# Patient Record
Sex: Female | Born: 1985 | Race: Black or African American | Hispanic: No | Marital: Single | Smoking: Former smoker
Health system: Southern US, Community
[De-identification: ages and names within clinical notes are randomized; demographics above are authoritative.]

## PROBLEM LIST (undated history)

## (undated) ENCOUNTER — Inpatient Hospital Stay (HOSPITAL_COMMUNITY): Payer: Self-pay

## (undated) DIAGNOSIS — A599 Trichomoniasis, unspecified: Secondary | ICD-10-CM

## (undated) DIAGNOSIS — Z202 Contact with and (suspected) exposure to infections with a predominantly sexual mode of transmission: Secondary | ICD-10-CM

## (undated) DIAGNOSIS — B999 Unspecified infectious disease: Secondary | ICD-10-CM

## (undated) HISTORY — PX: NO PAST SURGERIES: SHX2092

---

## 2007-03-19 ENCOUNTER — Emergency Department (HOSPITAL_COMMUNITY): Admission: EM | Admit: 2007-03-19 | Discharge: 2007-03-20 | Payer: Self-pay | Admitting: Emergency Medicine

## 2007-10-09 ENCOUNTER — Emergency Department (HOSPITAL_COMMUNITY): Admission: EM | Admit: 2007-10-09 | Discharge: 2007-10-09 | Payer: Self-pay | Admitting: Emergency Medicine

## 2008-04-11 ENCOUNTER — Emergency Department (HOSPITAL_COMMUNITY): Admission: EM | Admit: 2008-04-11 | Discharge: 2008-04-12 | Payer: Self-pay | Admitting: Emergency Medicine

## 2009-03-18 ENCOUNTER — Inpatient Hospital Stay (HOSPITAL_COMMUNITY): Admission: AD | Admit: 2009-03-18 | Discharge: 2009-03-18 | Payer: Self-pay | Admitting: Obstetrics & Gynecology

## 2009-07-04 ENCOUNTER — Emergency Department (HOSPITAL_COMMUNITY): Admission: EM | Admit: 2009-07-04 | Discharge: 2009-07-04 | Payer: Self-pay | Admitting: Emergency Medicine

## 2009-07-10 ENCOUNTER — Emergency Department (HOSPITAL_COMMUNITY): Admission: EM | Admit: 2009-07-10 | Discharge: 2009-07-10 | Payer: Self-pay | Admitting: Family Medicine

## 2009-07-23 ENCOUNTER — Emergency Department (HOSPITAL_COMMUNITY): Admission: EM | Admit: 2009-07-23 | Discharge: 2009-07-24 | Payer: Self-pay | Admitting: Emergency Medicine

## 2009-09-09 ENCOUNTER — Inpatient Hospital Stay (HOSPITAL_COMMUNITY): Admission: AD | Admit: 2009-09-09 | Discharge: 2009-09-09 | Payer: Self-pay | Admitting: Family Medicine

## 2009-09-23 ENCOUNTER — Emergency Department (HOSPITAL_COMMUNITY): Admission: EM | Admit: 2009-09-23 | Discharge: 2009-09-23 | Payer: Self-pay | Admitting: Family Medicine

## 2010-04-01 ENCOUNTER — Emergency Department (HOSPITAL_COMMUNITY): Admission: EM | Admit: 2010-04-01 | Discharge: 2010-04-02 | Payer: Self-pay | Admitting: Emergency Medicine

## 2010-07-27 LAB — URINALYSIS, ROUTINE W REFLEX MICROSCOPIC
Bilirubin Urine: NEGATIVE
Glucose, UA: NEGATIVE mg/dL
Nitrite: NEGATIVE
Specific Gravity, Urine: 1.022 (ref 1.005–1.030)
Urobilinogen, UA: 0.2 mg/dL (ref 0.0–1.0)

## 2010-08-03 LAB — POCT URINALYSIS DIP (DEVICE)
Glucose, UA: NEGATIVE mg/dL
Hgb urine dipstick: NEGATIVE
Nitrite: NEGATIVE
Protein, ur: NEGATIVE mg/dL
pH: 5.5 (ref 5.0–8.0)

## 2010-08-03 LAB — WET PREP, GENITAL: WBC, Wet Prep HPF POC: NONE SEEN

## 2010-08-03 LAB — URINALYSIS, ROUTINE W REFLEX MICROSCOPIC
Bilirubin Urine: NEGATIVE
Nitrite: NEGATIVE
Protein, ur: NEGATIVE mg/dL
pH: 7.5 (ref 5.0–8.0)

## 2010-08-03 LAB — POCT PREGNANCY, URINE
Preg Test, Ur: NEGATIVE
Preg Test, Ur: NEGATIVE

## 2010-08-03 LAB — GC/CHLAMYDIA PROBE AMP, GENITAL: Chlamydia, DNA Probe: NEGATIVE

## 2010-08-04 LAB — WET PREP, GENITAL
Clue Cells Wet Prep HPF POC: NONE SEEN
Trich, Wet Prep: NONE SEEN

## 2010-08-04 LAB — POCT PREGNANCY, URINE: Preg Test, Ur: NEGATIVE

## 2010-08-18 LAB — GC/CHLAMYDIA PROBE AMP, GENITAL
Chlamydia, DNA Probe: NEGATIVE
GC Probe Amp, Genital: NEGATIVE

## 2010-08-18 LAB — URINALYSIS, ROUTINE W REFLEX MICROSCOPIC: Protein, ur: NEGATIVE mg/dL

## 2010-08-18 LAB — CBC
HCT: 40 % (ref 36.0–46.0)
RDW: 13.5 % (ref 11.5–15.5)
WBC: 3.6 10*3/uL — ABNORMAL LOW (ref 4.0–10.5)

## 2010-08-18 LAB — WET PREP, GENITAL: Clue Cells Wet Prep HPF POC: NONE SEEN

## 2010-08-18 LAB — URINE MICROSCOPIC-ADD ON

## 2010-10-06 ENCOUNTER — Emergency Department (HOSPITAL_COMMUNITY)
Admission: EM | Admit: 2010-10-06 | Discharge: 2010-10-07 | Disposition: A | Discharge status: Home / Self Care | Payer: Self-pay | Attending: Emergency Medicine | Admitting: Emergency Medicine

## 2010-10-06 DIAGNOSIS — R5383 Other fatigue: Secondary | ICD-10-CM | POA: Insufficient documentation

## 2010-10-06 DIAGNOSIS — R000 Tachycardia, unspecified: Secondary | ICD-10-CM | POA: Insufficient documentation

## 2010-10-06 DIAGNOSIS — R109 Unspecified abdominal pain: Secondary | ICD-10-CM | POA: Insufficient documentation

## 2010-10-06 DIAGNOSIS — R5381 Other malaise: Secondary | ICD-10-CM | POA: Insufficient documentation

## 2010-10-06 DIAGNOSIS — R070 Pain in throat: Secondary | ICD-10-CM | POA: Insufficient documentation

## 2010-10-06 DIAGNOSIS — J020 Streptococcal pharyngitis: Secondary | ICD-10-CM | POA: Insufficient documentation

## 2010-10-06 DIAGNOSIS — R110 Nausea: Secondary | ICD-10-CM | POA: Insufficient documentation

## 2010-10-06 DIAGNOSIS — IMO0001 Reserved for inherently not codable concepts without codable children: Secondary | ICD-10-CM | POA: Insufficient documentation

## 2010-10-06 DIAGNOSIS — R509 Fever, unspecified: Secondary | ICD-10-CM | POA: Insufficient documentation

## 2010-10-06 LAB — BASIC METABOLIC PANEL
CO2: 24 mEq/L (ref 19–32)
Calcium: 9.5 mg/dL (ref 8.4–10.5)
GFR calc non Af Amer: >60 mL/min (ref >60)

## 2010-10-06 LAB — CBC
Hemoglobin: 14.1 g/dL (ref 12.0–15.0)
MCV: 81.4 fL (ref 78.0–100.0)
RDW: 13.4 % (ref 11.5–15.5)
WBC: 9.6 10*3/uL (ref 4.0–10.5)

## 2010-10-06 LAB — URINALYSIS, ROUTINE W REFLEX MICROSCOPIC
Bilirubin Urine: NEGATIVE
Hgb urine dipstick: NEGATIVE
Ketones, ur: >80 mg/dL — AB
Nitrite: NEGATIVE
Urobilinogen, UA: 1 mg/dL (ref 0.0–1.0)

## 2010-10-06 LAB — DIFFERENTIAL
Eosinophils Absolute: 0 10*3/uL (ref 0.0–0.7)
Lymphs Abs: 1.2 10*3/uL (ref 0.7–4.0)
Monocytes Absolute: 0.6 10*3/uL (ref 0.1–1.0)
Monocytes Relative: 6 % (ref 3–12)

## 2010-10-06 LAB — URINE MICROSCOPIC-ADD ON

## 2011-02-15 LAB — COMPREHENSIVE METABOLIC PANEL
ALT: 27
AST: 35
Albumin: 3.8
Alkaline Phosphatase: 61
Glucose, Bld: 91
Potassium: 3.7
Sodium: 137
Total Protein: 6.4

## 2011-02-15 LAB — DIFFERENTIAL
Basophils Relative: 0
Eosinophils Absolute: 0
Eosinophils Relative: 1
Monocytes Absolute: 0.4
Monocytes Relative: 9
Neutrophils Relative %: 48

## 2011-02-15 LAB — URINALYSIS, ROUTINE W REFLEX MICROSCOPIC
Ketones, ur: NEGATIVE
Nitrite: NEGATIVE
Specific Gravity, Urine: 1.025
pH: 7

## 2011-02-15 LAB — CBC
Hemoglobin: 13.5
RBC: 4.79
RDW: 13.8

## 2011-02-15 LAB — POCT PREGNANCY, URINE: Preg Test, Ur: NEGATIVE

## 2011-02-22 LAB — COMPREHENSIVE METABOLIC PANEL
AST: 26
Albumin: 4.2
BUN: 5 — ABNORMAL LOW
CO2: 32
Calcium: 9.4
Creatinine, Ser: 0.74
GFR calc Af Amer: >60
GFR calc non Af Amer: >60

## 2011-02-22 LAB — URINALYSIS, ROUTINE W REFLEX MICROSCOPIC
Bilirubin Urine: NEGATIVE
Glucose, UA: NEGATIVE
Hgb urine dipstick: NEGATIVE
Ketones, ur: NEGATIVE
pH: 7.5

## 2011-02-22 LAB — DIFFERENTIAL
Basophils Absolute: 0
Eosinophils Relative: 2
Lymphocytes Relative: 50 — ABNORMAL HIGH
Lymphs Abs: 3.1
Neutro Abs: 2.5

## 2011-02-22 LAB — URINE CULTURE: Colony Count: >=100000

## 2011-02-22 LAB — CBC
HCT: 40.7
MCHC: 33.6
MCV: 81.5
Platelets: 282

## 2011-02-22 LAB — LIPASE, BLOOD: Lipase: 32

## 2011-02-22 LAB — URINE MICROSCOPIC-ADD ON

## 2011-12-06 ENCOUNTER — Emergency Department (HOSPITAL_COMMUNITY)
Admission: EM | Admit: 2011-12-06 | Discharge: 2011-12-06 | Disposition: A | Discharge status: Home / Self Care | Payer: BC Managed Care – PPO | Attending: Emergency Medicine | Admitting: Emergency Medicine

## 2011-12-06 ENCOUNTER — Encounter (HOSPITAL_COMMUNITY): Payer: Self-pay | Admitting: Emergency Medicine

## 2011-12-06 DIAGNOSIS — F17200 Nicotine dependence, unspecified, uncomplicated: Secondary | ICD-10-CM | POA: Insufficient documentation

## 2011-12-06 DIAGNOSIS — F4321 Adjustment disorder with depressed mood: Secondary | ICD-10-CM | POA: Insufficient documentation

## 2011-12-06 MED ORDER — ALPRAZOLAM 0.5 MG PO TABS: 0.5000 mg | ORAL_TABLET | Freq: Every evening | ORAL | Status: AC | PRN

## 2011-12-06 MED ORDER — LORAZEPAM 2 MG/ML IJ SOLN
1.0000 mg | Freq: Once | INTRAMUSCULAR | Status: AC
  Administered 2011-12-06: 1 mg via INTRAVENOUS
  Filled 2011-12-06: qty 1
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
## 2011-12-06 NOTE — ED Provider Notes (Signed)
Medical screening examination/treatment/procedure(s) were performed by non-physician practitioner and as supervising physician I was immediately available for consultation/collaboration.   Esmeralda Malay, MD 12/06/11 0708 
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
## 2014-10-06 ENCOUNTER — Inpatient Hospital Stay (HOSPITAL_COMMUNITY): Payer: Medicaid Other

## 2014-10-06 ENCOUNTER — Inpatient Hospital Stay (HOSPITAL_COMMUNITY)
Admission: AD | Admit: 2014-10-06 | Discharge: 2014-10-06 | Disposition: A | Discharge status: Home / Self Care | Payer: Medicaid Other | Source: Ambulatory Visit | Attending: Obstetrics & Gynecology | Admitting: Obstetrics & Gynecology

## 2014-10-06 ENCOUNTER — Encounter (HOSPITAL_COMMUNITY): Payer: Self-pay | Admitting: *Deleted

## 2014-10-06 DIAGNOSIS — R109 Unspecified abdominal pain: Secondary | ICD-10-CM | POA: Diagnosis not present

## 2014-10-06 DIAGNOSIS — Z3A01 Less than 8 weeks gestation of pregnancy: Secondary | ICD-10-CM | POA: Insufficient documentation

## 2014-10-06 DIAGNOSIS — O9989 Other specified diseases and conditions complicating pregnancy, childbirth and the puerperium: Secondary | ICD-10-CM | POA: Diagnosis not present

## 2014-10-06 DIAGNOSIS — O26899 Other specified pregnancy related conditions, unspecified trimester: Secondary | ICD-10-CM

## 2014-10-06 HISTORY — DX: Contact with and (suspected) exposure to infections with a predominantly sexual mode of transmission: Z20.2

## 2014-10-06 LAB — URINALYSIS, ROUTINE W REFLEX MICROSCOPIC
Bilirubin Urine: NEGATIVE
Glucose, UA: NEGATIVE mg/dL
Hgb urine dipstick: NEGATIVE
Ketones, ur: NEGATIVE mg/dL
LEUKOCYTES UA: NEGATIVE
Nitrite: NEGATIVE
PH: 6.5 (ref 5.0–8.0)
PROTEIN: NEGATIVE mg/dL
Specific Gravity, Urine: 1.015 (ref 1.005–1.030)
Urobilinogen, UA: 0.2 mg/dL (ref 0.0–1.0)

## 2014-10-06 LAB — HCG, QUANTITATIVE, PREGNANCY: HCG, BETA CHAIN, QUANT, S: 93 m[IU]/mL — AB (ref <5)

## 2014-10-06 LAB — POCT PREGNANCY, URINE: PREG TEST UR: POSITIVE — AB

## 2014-10-06 NOTE — Discharge Instructions (Signed)

## 2014-10-06 NOTE — MAU Note (Signed)
+  HPT on Sat. Having sharp little pains in lower stomach, started yesterday.

## 2014-10-06 NOTE — MAU Provider Note (Signed)
  History   G1 in c/o early pregnant having intermittat sharp low abd pain this is pts first pregnancy. Unsure of dates.  CSN: [id number]  Arrival date and time: 10/06/14 1421   None     Chief Complaint  Patient presents with  . Abdominal Pain  . Possible Pregnancy   HPI  OB History    Gravida Para Term Preterm AB TAB SAB Ectopic Multiple Living   1               Past Medical History  Diagnosis Date  . Trichomonas contact, treated     History reviewed. No pertinent past surgical history.  History reviewed. No pertinent family history.  History  Substance Use Topics  . Smoking status: Current Every Day Smoker  . Smokeless tobacco: Not on file  . Alcohol Use: No    Allergies: No Known Allergies  No prescriptions prior to admission    Review of Systems  Constitutional: Negative.   HENT: Negative.   Eyes: Negative.   Respiratory: Negative.   Cardiovascular: Negative.   Gastrointestinal: Positive for abdominal pain.  Genitourinary: Negative.   Musculoskeletal: Negative.   Skin: Negative.   Neurological: Negative.   Endo/Heme/Allergies: Negative.   Psychiatric/Behavioral: Negative.    Physical Exam   Blood pressure 114/62, pulse 77, temperature 98.5 F (36.9 C), temperature source Oral, resp. rate 16, last menstrual period 09/11/2014.  Physical Exam  Constitutional: She is oriented to person, place, and time. She appears well-developed and well-nourished.  HENT:  Head: Normocephalic.  Eyes: Pupils are equal, round, and reactive to light.  Neck: Normal range of motion.  Cardiovascular: Normal rate, regular rhythm, normal heart sounds and intact distal pulses.   Respiratory: Effort normal and breath sounds normal.  GI: Soft. Bowel sounds are normal.  Genitourinary: Vagina normal and uterus normal.  Musculoskeletal: Normal range of motion.  Neurological: She is alert and oriented to person, place, and time. She has normal reflexes.  Skin: Skin is warm  and dry.  Psychiatric: She has a normal mood and affect. Her behavior is normal. Judgment and thought content normal.    MAU Course  Procedures  MDM abd pain in early pregnancy  Assessment and Plan  Quant, abo, pelvic u/s to r/o ectopic.  Quant 93 u/s normal will repeat quant on 25th and d/c home.  Wyvonnia DuskyLAWSON, Annisa Mazzarella DARLENE 10/06/2014, 4:51 PM

## 2014-10-07 ENCOUNTER — Telehealth: Payer: Self-pay | Admitting: *Deleted

## 2014-10-07 LAB — ABO/RH: ABO/RH(D): B POS

## 2014-10-07 NOTE — Telephone Encounter (Signed)
Pt left message stating that she needs prenatal vitamin prescription. Per chart review, pt was seen @ MAU yesterday, is early pregnant with unsure dates and BHCG 93. I called pt and advised her that she may purchase prenatal vitamins without a prescription at any pharmacy. If preferred, she may wait for her follow up testing to confirm that her pregnancy is progressing before beginning the vitamins. Pt voiced understanding.

## 2014-10-08 ENCOUNTER — Encounter (HOSPITAL_COMMUNITY): Payer: Self-pay

## 2014-10-08 ENCOUNTER — Inpatient Hospital Stay (HOSPITAL_COMMUNITY)
Admission: AD | Admit: 2014-10-08 | Discharge: 2014-10-08 | Disposition: A | Discharge status: Home / Self Care | Payer: Medicaid Other | Source: Ambulatory Visit | Attending: Obstetrics & Gynecology | Admitting: Obstetrics & Gynecology

## 2014-10-08 DIAGNOSIS — R888 Abnormal findings in other body fluids and substances: Secondary | ICD-10-CM | POA: Insufficient documentation

## 2014-10-08 DIAGNOSIS — O26899 Other specified pregnancy related conditions, unspecified trimester: Secondary | ICD-10-CM

## 2014-10-08 DIAGNOSIS — O9989 Other specified diseases and conditions complicating pregnancy, childbirth and the puerperium: Secondary | ICD-10-CM | POA: Insufficient documentation

## 2014-10-08 DIAGNOSIS — O0281 Inappropriate change in quantitative human chorionic gonadotropin (hCG) in early pregnancy: Secondary | ICD-10-CM

## 2014-10-08 DIAGNOSIS — R109 Unspecified abdominal pain: Secondary | ICD-10-CM | POA: Diagnosis present

## 2014-10-08 DIAGNOSIS — Z3A01 Less than 8 weeks gestation of pregnancy: Secondary | ICD-10-CM | POA: Diagnosis not present

## 2014-10-08 LAB — HCG, QUANTITATIVE, PREGNANCY: HCG, BETA CHAIN, QUANT, S: 257 m[IU]/mL — AB (ref <5)

## 2014-10-08 NOTE — Discharge Instructions (Signed)

## 2014-10-08 NOTE — MAU Provider Note (Signed)
Subjective:  Sonia Maxwell is a 29 y.o. female G2P0 at 403w6d who presents to MAU for a beta hcg level. She was originally seen two days ago with abdominal pain. Currently she has no pain or bleeding.    Objective:  GENERAL: Well-developed, well-nourished female in no acute distress.  LUNGS: Effort normal SKIN: Warm, dry and without erythema PSYCH: Normal mood and affect  Filed Vitals:   10/08/14 1754  BP: 108/67  Pulse: 71  Temp: 98.8 F (37.1 C)  Resp: 18    MDM: Quant 5/23: 93 Quant 5/25: 257   Assessment:  1. Elevated level of quantitative hCG for gestational age in early pregnancy   2. Abdominal pain in pregnancy      Plan:  Discharge home in stable condition  Repeat US in 7 days; US to call and schedule Pelvic rest Ectopic precautions  Return to MAU if symptoms worsen     Duane LopeJennifer I Luetta Piazza, NP 10/08/2014 7:08 PM

## 2014-10-08 NOTE — MAU Note (Signed)
Urine in lab 

## 2014-10-08 NOTE — MAU Note (Signed)
Pt presents for follow up bloodwork. Denies pain or bleeding.

## 2014-10-15 ENCOUNTER — Inpatient Hospital Stay (HOSPITAL_COMMUNITY)
Admission: AD | Admit: 2014-10-15 | Discharge: 2014-10-15 | Disposition: A | Discharge status: Home / Self Care | Payer: Medicaid Other | Source: Ambulatory Visit | Attending: Family Medicine | Admitting: Family Medicine

## 2014-10-15 ENCOUNTER — Ambulatory Visit (HOSPITAL_COMMUNITY)
Admission: RE | Admit: 2014-10-15 | Discharge: 2014-10-15 | Disposition: A | Discharge status: Home / Self Care | Payer: Medicaid Other | Source: Ambulatory Visit | Attending: Obstetrics and Gynecology | Admitting: Obstetrics and Gynecology

## 2014-10-15 ENCOUNTER — Other Ambulatory Visit: Payer: Self-pay | Admitting: Obstetrics and Gynecology

## 2014-10-15 ENCOUNTER — Encounter (HOSPITAL_COMMUNITY): Payer: Self-pay | Admitting: Obstetrics and Gynecology

## 2014-10-15 DIAGNOSIS — Z36 Encounter for antenatal screening of mother: Secondary | ICD-10-CM | POA: Diagnosis not present

## 2014-10-15 DIAGNOSIS — O34531 Maternal care for retroversion of gravid uterus, first trimester: Secondary | ICD-10-CM | POA: Insufficient documentation

## 2014-10-15 DIAGNOSIS — R109 Unspecified abdominal pain: Principal | ICD-10-CM

## 2014-10-15 DIAGNOSIS — O26899 Other specified pregnancy related conditions, unspecified trimester: Secondary | ICD-10-CM

## 2014-10-15 DIAGNOSIS — O9989 Other specified diseases and conditions complicating pregnancy, childbirth and the puerperium: Secondary | ICD-10-CM | POA: Insufficient documentation

## 2014-10-15 DIAGNOSIS — O0281 Inappropriate change in quantitative human chorionic gonadotropin (hCG) in early pregnancy: Secondary | ICD-10-CM

## 2014-10-15 DIAGNOSIS — Z3A01 Less than 8 weeks gestation of pregnancy: Secondary | ICD-10-CM | POA: Insufficient documentation

## 2014-10-15 NOTE — MAU Note (Cosign Needed)
Chief Complaint: Follow-up      SUBJECTIVE HPI: Sonia Maxwell is a 29 y.o. G2P0010 at [redacted]w[redacted]d by LMP who presents for [place] results. She was initially seen here 10/06/2014 for lower abdominal pain in early pregnancy. She was seen again 10/08/2014 for follow-up quant and had appropriate rise. She has had no vaginal bleeding. She has had no abdominal pain since last seen. She is beginning to have some subjective symptoms of pregnancy.   Past Medical History  Diagnosis Date  . Trichomonas contact, treated    OB History  Gravida Para Term Preterm AB SAB TAB Ectopic Multiple Living  2             # Outcome Date GA Lbr Len/2nd Weight Sex Delivery Anes PTL Lv  2 Current           1 Gravida                No past surgical history on file. History   Social History  . Marital Status: Single    Spouse Name: N/A  . Number of Children: N/A  . Years of Education: N/A   Occupational History  . Not on file.   Social History Main Topics  . Smoking status: Current Every Day Smoker  . Smokeless tobacco: Not on file  . Alcohol Use: No  . Drug Use: No  . Sexual Activity: Yes     Comment: last sex Oct 04 2014   Other Topics Concern  . Not on file   Social History Narrative   No current facility-administered medications on file prior to encounter.   No current outpatient prescriptions on file prior to encounter.   No Known Allergies  ROS  OBJECTIVE Blood pressure 108/67, pulse 71, temperature 98.8 F (37.1 C), temperature source Oral, resp. rate 18, last menstrual period 09/11/2014. GENERAL: Well-developed, well-nourished female in no acute distress.  ABD: NT  LAB RESULTS   IMAGING US Ob Comp Less 14 Wks  10/06/2014   CLINICAL DATA:  Abdominal pain. Pregnant patient. Patient 3 weeks and 4 days pregnant based on her last menstrual period. Beta HCG level is pending.  EXAM: OBSTETRIC <14 WK [place] AND TRANSVAGINAL OB US  TECHNIQUE: Both transabdominal and transvaginal ultrasound  examinations were performed for complete evaluation of the gestation as well as the maternal uterus, adnexal regions, and pelvic cul-de-sac. Transvaginal technique was performed to assess early pregnancy.  COMPARISON:  None.  FINDINGS: Intrauterine gestational sac: None  Yolk sac:  None  Embryo:  No  Maternal uterus/adnexae: No uterine masses. Endometrium measures 12 mm in thickness. No endometrial mass or fluid. Cervix is closed and unremarkable. Small fluid collections seen along the anterior margin of the uterine fundus. This reflect fluid in bowel. It may reflect small collection physiologic peritoneal fluid in the vesicular uterine pelvic recess. Ovaries are unremarkable. No adnexal mass.  IMPRESSION: 1. No evidence of intrauterine pregnancy. However, findings may still reflect an early intrauterine pregnancy. 2. No adnexal mass. Ovaries are unremarkable. No uterine masses. No findings to explain abdominal pain. 3. Small fluid collects over the anterior uterine fundus, nonspecific. If the diagnosis includes a loop of bowel, small area focal physiologic free fluid or an inclusion cyst. 4. Recommend followup beta HCG levels, followup ultrasound in 7-10 days to document normal pregnancy progression.   Electronically Signed   By: Amie Portland M.D.   On: 10/06/2014 17:47   US Ob Transvaginal  10/15/2014   CLINICAL DATA:  Elevated HCG.  Abdominal pain.  EXAM: TRANSVAGINAL OB ULTRASOUND  TECHNIQUE: Transvaginal ultrasound was performed for complete evaluation of the gestation as well as the maternal uterus, adnexal regions, and pelvic cul-de-sac.  COMPARISON:  10/06/2014  FINDINGS: Intrauterine gestational sac: Visualized/normal in shape.  Yolk sac:  Not visualized  Embryo:  Not visualized  Cardiac Activity: Not visualize  Heart Rate:  bpm  MSD: 4.7  mm   5 w   0  d  CRL:     mm    w  d                  US EDC:  Maternal uterus/adnexae: No subchorionic hemorrhage. Uterus is retroverted. No adnexal masses. Trace  free fluid in the pelvis.  IMPRESSION: Early intrauterine gestational sac, 5 weeks 0 days by mean sac diameter. No yolk sac or fetal pole currently. Recommend continued followup with repeat ultrasound in 14 days to evaluate for continued expected progression.   Electronically Signed   By: Charlett NoseKevin  Dover M.D.   On: 10/15/2014 14:45   [place] Ob Transvaginal  10/06/2014   CLINICAL DATA:  Abdominal pain. Pregnant patient. Patient 3 weeks and 4 days pregnant based on her last menstrual period. Beta HCG level is pending.  EXAM: OBSTETRIC <14 WK US AND TRANSVAGINAL OB US  TECHNIQUE: Both transabdominal and transvaginal ultrasound examinations were performed for complete evaluation of the gestation as well as the maternal uterus, adnexal regions, and pelvic cul-de-sac. Transvaginal technique was performed to assess early pregnancy.  COMPARISON:  None.  FINDINGS: Intrauterine gestational sac: None  Yolk sac:  None  Embryo:  No  Maternal uterus/adnexae: No uterine masses. Endometrium measures 12 mm in thickness. No endometrial mass or fluid. Cervix is closed and unremarkable. Small fluid collections seen along the anterior margin of the uterine fundus. This reflect fluid in bowel. It may reflect small collection physiologic peritoneal fluid in the vesicular uterine pelvic recess. Ovaries are unremarkable. No adnexal mass.  IMPRESSION: 1. No evidence of intrauterine pregnancy. However, findings may still reflect an early intrauterine pregnancy. 2. No adnexal mass. Ovaries are unremarkable. No uterine masses. No findings to explain abdominal pain. 3. Small fluid collects over the anterior uterine fundus, nonspecific. If the diagnosis includes a loop of bowel, small area focal physiologic free fluid or an inclusion cyst. 4. Recommend followup beta HCG levels, followup ultrasound in 7-10 days to document normal pregnancy progression.   Electronically Signed   By: Amie Portlandavid  Ormond M.D.   On: 10/06/2014 17:47  [place] Ob  Transvaginal  10/15/2014   CLINICAL DATA:  Elevated HCG.  Abdominal pain.  EXAM: TRANSVAGINAL OB ULTRASOUND  TECHNIQUE: Transvaginal ultrasound was performed for complete evaluation of the gestation as well as the maternal uterus, adnexal regions, and pelvic cul-de-sac.  COMPARISON:  10/06/2014  FINDINGS: Intrauterine gestational sac: Visualized/normal in shape.  Yolk sac:  Not visualized  Embryo:  Not visualized  Cardiac Activity: Not visualize  Heart Rate:  bpm  MSD: 4.7  mm   5 w   0  d  CRL:     mm    w  d                  US EDC:  Maternal uterus/adnexae: No subchorionic hemorrhage. Uterus is retroverted. No adnexal masses. Trace free fluid in the pelvis.  IMPRESSION: Early intrauterine gestational sac, 5 weeks 0 days by mean sac diameter. No yolk sac or fetal pole currently. Recommend continued followup with repeat ultrasound in  14 days to evaluate for continued expected progression.   Electronically Signed   By: Charlett Nose M.D.   On: 10/15/2014 14:45        Ref Range 7d ago  9d ago     hCG, Beta Chain, Quant, S <5 mIU/mL 257 (H) 93 (H)CM   Comments:              MAU COURSE  ASSESSMENT G2P0010 at [redacted]w[redacted]d Abd pain (resolved)  Pregnancy location and viability undetrmined   PLAN Discharge home with pain/bleeding precautions    Medication List    Notice    You have not been prescribed any medications.     [place] to schedule F/U US 10/27/14   Danae Orleans, CNM 10/15/2014  3:02 PM

## 2014-10-15 NOTE — Progress Notes (Signed)
Pt only seen by APP

## 2014-10-25 ENCOUNTER — Inpatient Hospital Stay (HOSPITAL_COMMUNITY)
Admission: AD | Admit: 2014-10-25 | Discharge: 2014-10-25 | Disposition: A | Discharge status: Home / Self Care | Payer: Medicaid Other | Source: Ambulatory Visit | Attending: Obstetrics & Gynecology | Admitting: Obstetrics & Gynecology

## 2014-10-25 ENCOUNTER — Encounter (HOSPITAL_COMMUNITY): Payer: Self-pay | Admitting: *Deleted

## 2014-10-25 ENCOUNTER — Inpatient Hospital Stay (HOSPITAL_COMMUNITY): Payer: Medicaid Other

## 2014-10-25 DIAGNOSIS — N760 Acute vaginitis: Secondary | ICD-10-CM

## 2014-10-25 DIAGNOSIS — O209 Hemorrhage in early pregnancy, unspecified: Secondary | ICD-10-CM

## 2014-10-25 DIAGNOSIS — N920 Excessive and frequent menstruation with regular cycle: Secondary | ICD-10-CM

## 2014-10-25 DIAGNOSIS — Z87891 Personal history of nicotine dependence: Secondary | ICD-10-CM | POA: Diagnosis not present

## 2014-10-25 DIAGNOSIS — O4690 Antepartum hemorrhage, unspecified, unspecified trimester: Secondary | ICD-10-CM

## 2014-10-25 DIAGNOSIS — N898 Other specified noninflammatory disorders of vagina: Secondary | ICD-10-CM | POA: Diagnosis present

## 2014-10-25 DIAGNOSIS — B9689 Other specified bacterial agents as the cause of diseases classified elsewhere: Secondary | ICD-10-CM

## 2014-10-25 LAB — URINALYSIS, ROUTINE W REFLEX MICROSCOPIC
Bilirubin Urine: NEGATIVE
GLUCOSE, UA: NEGATIVE mg/dL
KETONES UR: NEGATIVE mg/dL
Leukocytes, UA: NEGATIVE
Nitrite: NEGATIVE
Protein, ur: NEGATIVE mg/dL
Specific Gravity, Urine: 1.01 (ref 1.005–1.030)
Urobilinogen, UA: 0.2 mg/dL (ref 0.0–1.0)
pH: 5.5 (ref 5.0–8.0)

## 2014-10-25 LAB — HCG, QUANTITATIVE, PREGNANCY: hCG, Beta Chain, Quant, S: 3000 m[IU]/mL — ABNORMAL HIGH (ref <5)

## 2014-10-25 LAB — URINE MICROSCOPIC-ADD ON

## 2014-10-25 NOTE — MAU Provider Note (Signed)
History  29 yo G1P0 @ 6.2 wks by sure LMP on 09/11/14 presents to MAU after calling w/ c/o pink tinged discharge (noted only after wiping) since earlier today. Intercourse today. Currently undergoing treatment for BV. Denies cramping.   Patient Active Problem List   Diagnosis Date Noted  . Spotting 10/25/2014  . BV (bacterial vaginosis) 10/25/2014    Chief Complaint  Patient presents with  . Vaginal Discharge   HPI As above OB History    Gravida Para Term Preterm AB TAB SAB Ectopic Multiple Living   1               Past Medical History  Diagnosis Date  . Trichomonas contact, treated     Past Surgical History  Procedure Laterality Date  . No past surgeries      No family history on file.  History  Substance Use Topics  . Smoking status: Former Smoker    Quit date: 09/24/2014  . Smokeless tobacco: Not on file  . Alcohol Use: No    Allergies: No Known Allergies  Prescriptions prior to admission  Medication Sig Dispense Refill Last Dose  . metroNIDAZOLE (FLAGYL) 500 MG tablet Take 1 tablet by mouth 2 (two) times daily.  0 10/25/2014 at Unknown time  . Prenatal Vit-Fe Fumarate-FA (PRENATAL MULTIVITAMIN) TABS tablet Take 1 tablet by mouth daily at 12 noon.   10/25/2014 at Unknown time    ROS  As noted in history Physical Exam  CLINICAL DATA: Pinkish discharge. No pain.  EXAM: OBSTETRIC <14 WK ULTRASOUND  TECHNIQUE: Transabdominal ultrasound was performed for evaluation of the gestation as well as the maternal uterus and adnexal regions.  COMPARISON: 10/15/2014  FINDINGS: Intrauterine gestational sac: Single intrauterine sac  Yolk sac: No  Embryo: No  Cardiac Activity: No  MSD: 8.1 mm 5 w 3 d  Maternal uterus/adnexae: 2 fibroids are present, one in the fundus measuring 1.4 cm and the other posteriorly measuring 1.7 cm. No free pelvic fluid.  IMPRESSION: Findings are suspicious but not yet definitive for failed  pregnancy. Recommend follow-up US in 10-14 days for definitive diagnosis. This recommendation follows SRU consensus guidelines: Diagnostic Criteria for Nonviable Pregnancy Early in the First Trimester. Malva Limes Med 2013; 409:8119-14.   Electronically Signed  By: Ellery Plunk M.D.  On: 10/25/2014 21:57  Results for orders placed or performed during the hospital encounter of 10/25/14 (from the past 24 hour(s))  Urinalysis, Routine w reflex microscopic (not at [facility])     Status: Abnormal   Collection Time: 10/25/14  7:40 PM  Result Value Ref Range   Color, Urine YELLOW YELLOW   APPearance CLEAR CLEAR   Specific Gravity, Urine 1.010 1.005 - 1.030   pH 5.5 5.0 - 8.0   Glucose, UA NEGATIVE NEGATIVE mg/dL   Hgb urine dipstick TRACE (A) NEGATIVE   Bilirubin Urine NEGATIVE NEGATIVE   Ketones, ur NEGATIVE NEGATIVE mg/dL   Protein, ur NEGATIVE NEGATIVE mg/dL   Urobilinogen, UA 0.2 0.0 - 1.0 mg/dL   Nitrite NEGATIVE NEGATIVE   Leukocytes, UA NEGATIVE NEGATIVE  Urine microscopic-add on     Status: Abnormal   Collection Time: 10/25/14  7:40 PM  Result Value Ref Range   Squamous Epithelial / LPF FEW (A) RARE   WBC, UA 0-2 <3 WBC/hpf   RBC / HPF 0-2 <3 RBC/hpf   Bacteria, UA FEW (A) RARE  hCG, quantitative, pregnancy     Status: Abnormal   Collection Time: 10/25/14 10:20 PM  Result  Value Ref Range   hCG, Beta Chain, Quant, S 3000 (H) <5 mIU/mL    Blood pressure 109/58, pulse 79, temperature 98.6 F (37 C), resp. rate 18, height 5\' 3"  (1.6 m), weight 55.067 kg (121 lb 6.4 oz), last menstrual period 09/11/2014.   Physical Exam Gen: NAD Lungs: CTAB CV: RRR w/o M/R/G Abdomen: soft, NT, no guarding or rebound NEFG Speculum: No blood in vault. Cvx visually closed Bimanual: No adnexal tenderness/masses  ED Course  Assessment: Intrauterine gestational sac Quants 3000 BV  Plan: Office f/u on Monday and Wednesday of next week for repeat quants. Repeat u/s in 10-14 days per  Radiologist's recommendation. Pelvic rest. Continue treatment for BV. Strict bleeding precautions.   Sherre Scarlet CNM, MS 10/25/2014 11:02 PM

## 2014-10-25 NOTE — MAU Note (Addendum)
I just went to the Br and had pink d/c on toilet paper. No pain. Has been using metro-gel for couple days for BV

## 2014-10-27 ENCOUNTER — Ambulatory Visit (HOSPITAL_COMMUNITY): Payer: Medicaid Other

## 2015-02-11 ENCOUNTER — Inpatient Hospital Stay (HOSPITAL_COMMUNITY)
Admission: AD | Admit: 2015-02-11 | Discharge: 2015-02-11 | Discharge status: Against Medical Advice | Payer: Medicaid Other | Source: Ambulatory Visit | Attending: Obstetrics and Gynecology | Admitting: Obstetrics and Gynecology

## 2015-02-11 ENCOUNTER — Encounter (HOSPITAL_COMMUNITY): Payer: Self-pay | Admitting: *Deleted

## 2015-02-11 DIAGNOSIS — R197 Diarrhea, unspecified: Secondary | ICD-10-CM | POA: Diagnosis present

## 2015-02-11 HISTORY — DX: Trichomoniasis, unspecified: A59.9

## 2015-02-11 HISTORY — DX: Unspecified infectious disease: B99.9

## 2015-02-11 LAB — URINALYSIS, ROUTINE W REFLEX MICROSCOPIC
Bilirubin Urine: NEGATIVE
GLUCOSE, UA: NEGATIVE mg/dL
KETONES UR: NEGATIVE mg/dL
LEUKOCYTES UA: NEGATIVE
NITRITE: NEGATIVE
PROTEIN: NEGATIVE mg/dL
Specific Gravity, Urine: >1.03 — ABNORMAL HIGH (ref 1.005–1.030)
UROBILINOGEN UA: 0.2 mg/dL (ref 0.0–1.0)
pH: 6 (ref 5.0–8.0)

## 2015-02-11 LAB — URINE MICROSCOPIC-ADD ON

## 2015-02-11 LAB — POCT PREGNANCY, URINE: PREG TEST UR: NEGATIVE

## 2015-02-11 NOTE — MAU Note (Signed)
Was [facility] at the end of August, they told her she was preg.  Had blood work drawn, was never called with results.  Had SAB in June

## 2015-02-11 NOTE — Progress Notes (Signed)
V. Standard, CNM in to assess patient.

## 2015-02-11 NOTE — MAU Note (Signed)
Call Venus Standard CNM with patient notification will be a little while before she can evaluate patient in MAU in L&D.

## 2015-02-11 NOTE — MAU Provider Note (Signed)
Sonia Maxwell is a 29 y.o. G1P0, non-pregnant female presents to MAU unannounced c/o diarrhea that started last night. Stating "it's something going around at my job".  Her last episode was this morning at 0600.  She is refusing to try to give a stool sample stating "I'm scared to eat so there's nothing in there to come out".  Denies abd pain, n/v. When asked did she called for an office appointment she reply "the hospital was closer"  She report she thought she was pregnant in August but didn't call [facility] to confirm.  Jake Samples notes say she was pregnant and had a miscarriage in June 2016.  Last quant 11/12/14 was 6.9.  On August 5 she reported a 8 day menses.  Quant on 12/22/14 was 9.0 with a positive pregnancy test.  On 8/10 quant result 2.5 and <2.o on 8/17.  Her results was sent to the portal.  History     Patient Active Problem List   Diagnosis Date Noted  . Spotting 10/25/2014  . BV (bacterial vaginosis) 10/25/2014    Chief Complaint  Patient presents with  . Diarrhea   HPI  OB History    Gravida Para Term Preterm AB TAB SAB Ectopic Multiple Living   Past Medical History  Diagnosis Date  . Trichomonas contact, treated   . Infection     UTI  . Trichimoniasis     Past Surgical History  Procedure Laterality Date  . No past surgeries      Family History  Problem Relation Age of Onset  . Diabetes Mother   . Asthma Sister     Social History  Substance Use Topics  . Smoking status: Current Every Day Smoker -- 0.50 packs/day for 4 years    Types: Cigarettes, E-cigarettes  . Smokeless tobacco: Never Used     Comment: vape  . Alcohol Use: No    Allergies: No Known Allergies  Prescriptions prior to admission  Medication Sig Dispense Refill Last Dose  . metroNIDAZOLE (FLAGYL) 500 MG tablet Take 1 tablet by mouth 2 (two) times daily.  0 10/25/2014 at Unknown time  . Prenatal Vit-Fe Fumarate-FA (PRENATAL MULTIVITAMIN) TABS tablet Take 1 tablet by  mouth daily at 12 noon.   10/25/2014 at Unknown time    ROS See HPI above, all other systems are negative  Physical Exam   Blood pressure 109/83, pulse 70, temperature 98.3 F (36.8 C), temperature source Oral, resp. rate 16, last menstrual period 02/09/2015, unknown if currently breastfeeding.  Physical Exam SVE: deferred   ED Course  Assessment: Possible C-Diff   Plan: Labs: CBC DC to home if WNL Brat diet PO fluids Gatorade  Make office appointment if s/s continues    Venus Standard, CNM, MSN 02/11/2015. 10:55 AM     Addendum 1126 Pt left AMA prior to lab work

## 2015-02-11 NOTE — MAU Note (Signed)
Diarrhea started last night, unsure if from something she ate or what. Stomach virus going around at work.

## 2015-10-04 ENCOUNTER — Inpatient Hospital Stay (HOSPITAL_COMMUNITY)
Admission: AD | Admit: 2015-10-04 | Discharge: 2015-10-04 | Disposition: A | Discharge status: Home / Self Care | Payer: Medicaid Other | Source: Ambulatory Visit | Attending: Obstetrics and Gynecology | Admitting: Obstetrics and Gynecology

## 2015-10-04 DIAGNOSIS — N912 Amenorrhea, unspecified: Secondary | ICD-10-CM | POA: Insufficient documentation

## 2015-10-04 DIAGNOSIS — N911 Secondary amenorrhea: Secondary | ICD-10-CM | POA: Diagnosis not present

## 2015-10-04 MED ORDER — CONCEPT OB 130-92.4-1 MG PO CAPS: 1.0000 | ORAL_CAPSULE | Freq: Every day | ORAL | Status: DC

## 2015-10-04 NOTE — Discharge Instructions (Signed)
Patient advised to follow-up with [facility] for pregnancy confirmation  Monday-Thursday 8am-4pm or Friday 8am-11am Patient may return to MAU as needed or if her condition were to change or worsen

## 2015-10-04 NOTE — MAU Provider Note (Signed)
Ms.Lossie Kyla BalzarineCarmichael is a 30 y.o. G1P0010 at 5.5 weeks by LMP who presents to MAU today for pregnancy verification. Pos home UPT. The patient denies abdominal pain or vaginal bleeding today.   BP 108/66 mmHg  Pulse 73  Temp(Src) 98.2 F (36.8 C) (Oral)  Resp 16  Ht 5\' 3"  (1.6 m)  Wt 125 lb 3.2 oz (56.79 kg)  BMI 22.18 kg/m2  CONSTITUTIONAL: Well-developed, well-nourished female in no acute distress.  CARDIOVASCULAR: Regular heart rate RESPIRATORY: Normal effort NEUROLOGICAL: Alert and oriented to person, place, and time.  SKIN: Skin is warm and dry. No rash noted. Not diaphoretic. No erythema. No pallor. PSYCH: Normal mood and affect. Normal behavior. Normal judgment and thought content.  MDM Medical Screen Exam Complete  A: Amenorrhea Pos home UPT  P: Discharge from MAU Patient advised to follow-up with [facility] for pregnancy confirmation  Monday-Thursday 8am-4pm or Friday 8am-11am Patient may return to MAU as needed or if her condition were to change or worsen   [place] Ko[place] Chisenhall, CNM  10/04/2015 3:41 PM

## 2015-10-04 NOTE — MAU Note (Signed)
Patient presents to confirm her pregnancy, positive pregnancy test at home, LMP 09/06/15

## 2015-10-07 ENCOUNTER — Inpatient Hospital Stay (HOSPITAL_COMMUNITY)
Admission: AD | Admit: 2015-10-07 | Discharge: 2015-10-07 | Disposition: A | Discharge status: Home / Self Care | Payer: Medicaid Other | Source: Ambulatory Visit | Attending: Obstetrics & Gynecology | Admitting: Obstetrics & Gynecology

## 2015-10-07 ENCOUNTER — Inpatient Hospital Stay (HOSPITAL_COMMUNITY): Payer: Medicaid Other

## 2015-10-07 ENCOUNTER — Encounter (HOSPITAL_COMMUNITY): Payer: Self-pay | Admitting: *Deleted

## 2015-10-07 DIAGNOSIS — O4691 Antepartum hemorrhage, unspecified, first trimester: Secondary | ICD-10-CM | POA: Diagnosis not present

## 2015-10-07 DIAGNOSIS — N760 Acute vaginitis: Secondary | ICD-10-CM | POA: Insufficient documentation

## 2015-10-07 DIAGNOSIS — O209 Hemorrhage in early pregnancy, unspecified: Secondary | ICD-10-CM | POA: Diagnosis not present

## 2015-10-07 DIAGNOSIS — F17210 Nicotine dependence, cigarettes, uncomplicated: Secondary | ICD-10-CM | POA: Diagnosis not present

## 2015-10-07 DIAGNOSIS — O23591 Infection of other part of genital tract in pregnancy, first trimester: Secondary | ICD-10-CM | POA: Diagnosis not present

## 2015-10-07 DIAGNOSIS — O99331 Smoking (tobacco) complicating pregnancy, first trimester: Secondary | ICD-10-CM | POA: Insufficient documentation

## 2015-10-07 DIAGNOSIS — Z3A01 Less than 8 weeks gestation of pregnancy: Secondary | ICD-10-CM | POA: Insufficient documentation

## 2015-10-07 DIAGNOSIS — R109 Unspecified abdominal pain: Secondary | ICD-10-CM | POA: Diagnosis present

## 2015-10-07 LAB — CBC
HCT: 40.5 % (ref 36.0–46.0)
Hemoglobin: 13.8 g/dL (ref 12.0–15.0)
MCH: 28.5 pg (ref 26.0–34.0)
MCHC: 34.1 g/dL (ref 30.0–36.0)
MCV: 83.5 fL (ref 78.0–100.0)
PLATELETS: 241 10*3/uL (ref 150–400)
RBC: 4.85 MIL/uL (ref 3.87–5.11)
RDW: 13.8 % (ref 11.5–15.5)
WBC: 5.2 10*3/uL (ref 4.0–10.5)

## 2015-10-07 LAB — URINALYSIS, ROUTINE W REFLEX MICROSCOPIC
Bilirubin Urine: NEGATIVE
GLUCOSE, UA: NEGATIVE mg/dL
Hgb urine dipstick: NEGATIVE
KETONES UR: NEGATIVE mg/dL
LEUKOCYTES UA: NEGATIVE
NITRITE: NEGATIVE
PROTEIN: NEGATIVE mg/dL
Specific Gravity, Urine: 1.01 (ref 1.005–1.030)
pH: 8 (ref 5.0–8.0)

## 2015-10-07 LAB — POCT PREGNANCY, URINE: Preg Test, Ur: POSITIVE — AB

## 2015-10-07 LAB — WET PREP, GENITAL
Sperm: NONE SEEN
TRICH WET PREP: NONE SEEN
YEAST WET PREP: NONE SEEN

## 2015-10-07 LAB — HCG, QUANTITATIVE, PREGNANCY: hCG, Beta Chain, Quant, S: 3342 m[IU]/mL — ABNORMAL HIGH (ref <5)

## 2015-10-07 MED ORDER — METRONIDAZOLE 500 MG PO TABS: 500.0000 mg | ORAL_TABLET | Freq: Two times a day (BID) | ORAL | Status: DC

## 2015-10-07 NOTE — MAU Provider Note (Signed)
History     CSN: [id number]  Arrival date and time: 10/07/15 1142   None     Chief Complaint  Patient presents with  . Abdominal Pain  . Vaginal Bleeding   HPIpt is [redacted]w[redacted]d pregnant G2P0010 with previous SAB within past year.  Pt started having spotting when went to bathroom and then started cramping about 4:30 am Pt denies UTI sx, constipation, diarrhea, or recent IC. RN note: Original Note by Job Founds Spurlock-Frizzell, RN (Registered Nurse) filed at 10/07/2015 12:02 PM   Expand All Collapse All   Woke up at 0430, noted blood when used restroom, has since started cramping. +HPT last weekend        Past Medical History  Diagnosis Date  . Trichomonas contact, treated   . Infection     UTI  . Trichimoniasis     Past Surgical History  Procedure Laterality Date  . No past surgeries      Family History  Problem Relation Age of Onset  . Diabetes Mother   . Asthma Sister     Social History  Substance Use Topics  . Smoking status: Current Every Day Smoker -- 0.50 packs/day for 4 years    Types: Cigarettes, E-cigarettes  . Smokeless tobacco: Never Used     Comment: vape  . Alcohol Use: No    Allergies: No Known Allergies  Prescriptions prior to admission  Medication Sig Dispense Refill Last Dose  . OVER THE COUNTER MEDICATION Take 1 tablet by mouth daily. Patient takes [facility] brand of Iron Supplement   10/07/2015 at Unknown time  . Prenatal Vit-Fe Fumarate-FA (PRENATAL MULTIVITAMIN) TABS tablet Take 1 tablet by mouth daily at 12 noon.   10/07/2015 at Unknown time  . Prenat w/o A Vit-FeFum-FePo-FA (CONCEPT OB) 130-92.4-1 MG CAPS Take 1 tablet by mouth daily. (Patient not taking: Reported on 10/07/2015) 30 capsule 12 Not Taking at Unknown time    Review of Systems  Constitutional: Negative for fever and chills.  Gastrointestinal: Positive for abdominal pain. Negative for nausea, vomiting, diarrhea and constipation.  Genitourinary: Negative for dysuria.   Physical Exam    Blood pressure 111/64, pulse 97, temperature 97.6 F (36.4 C), temperature source Oral, resp. rate 16, height 5\' 3"  (1.6 m), weight 124 lb 12.8 oz (56.609 kg), last menstrual period 09/06/2015, unknown if currently breastfeeding.  Physical Exam  Vitals reviewed. Constitutional: She is oriented to person, place, and time. She appears well-developed and well-nourished. No distress.  HENT:  Head: Normocephalic.  Eyes: Pupils are equal, round, and reactive to light.  Neck: Normal range of motion. Neck supple.  Cardiovascular: Normal rate.   Respiratory: Effort normal.  GI: Soft. She exhibits no distension. There is no tenderness. There is no rebound.  Genitourinary:  Small amount of white frothy discharge in vault; cervix clean, NT; uterus NSSC retroverted , NTt  Musculoskeletal: Normal range of motion.  Neurological: She is alert and oriented to person, place, and time.  Skin: Skin is warm and dry.  Psychiatric: She has a normal mood and affect.    MAU Course  Procedures Results for orders placed or performed during the hospital encounter of 10/07/15 (from the past 24 hour(s))  Urinalysis, Routine w reflex microscopic (not at [facility])     Status: None   Collection Time: 10/07/15 12:05 PM  Result Value Ref Range   Color, Urine YELLOW YELLOW   APPearance CLEAR CLEAR   Specific Gravity, Urine 1.010 1.005 - 1.030   pH 8.0 5.0 -  8.0   Glucose, UA NEGATIVE NEGATIVE mg/dL   Hgb urine dipstick NEGATIVE NEGATIVE   Bilirubin Urine NEGATIVE NEGATIVE   Ketones, ur NEGATIVE NEGATIVE mg/dL   Protein, ur NEGATIVE NEGATIVE mg/dL   Nitrite NEGATIVE NEGATIVE   Leukocytes, UA NEGATIVE NEGATIVE  Pregnancy, urine POC     Status: Abnormal   Collection Time: 10/07/15 12:13 PM  Result Value Ref Range   Preg Test, Ur POSITIVE (A) NEGATIVE  Wet prep, genital     Status: Abnormal   Collection Time: 10/07/15 12:25 PM  Result Value Ref Range   Yeast Wet Prep HPF POC NONE SEEN NONE SEEN   Trich, Wet  Prep NONE SEEN NONE SEEN   Clue Cells Wet Prep HPF POC PRESENT (A) NONE SEEN   WBC, Wet Prep HPF POC MODERATE (A) NONE SEEN   Sperm NONE SEEN   CBC     Status: None   Collection Time: 10/07/15 12:26 PM  Result Value Ref Range   WBC 5.2 4.0 - 10.5 K/uL   RBC 4.85 3.87 - 5.11 MIL/uL   Hemoglobin 13.8 12.0 - 15.0 g/dL   HCT 40.940.5 81.136.0 - 91.446.0 %   MCV 83.5 78.0 - 100.0 fL   MCH 28.5 26.0 - 34.0 pg   MCHC 34.1 30.0 - 36.0 g/dL   RDW 78.213.8 95.611.5 - 21.315.5 %   Platelets 241 150 - 400 K/uL  hCG, quantitative, pregnancy     Status: Abnormal   Collection Time: 10/07/15 12:26 PM  Result Value Ref Range   hCG, Beta Chain, Quant, S 3342 (H) <5 mIU/mL  [place] Ob Comp Less 14 Wks  10/07/2015  CLINICAL DATA:  30 year old pregnant female with vaginal bleeding. Quantitative beta HCG pending. EDC by LMP: 06/12/2016, projecting to an expected gestational age of [redacted] weeks 3 days. EXAM: OBSTETRIC <14 WK US AND TRANSVAGINAL OB US TECHNIQUE: Both transabdominal and transvaginal ultrasound examinations were performed for complete evaluation of the gestation as well as the maternal uterus, adnexal regions, and pelvic cul-de-sac. Transvaginal technique was performed to assess early pregnancy. COMPARISON:  No prior scans from this gestation. FINDINGS: Intrauterine gestational sac: Single intrauterine gestational sac appears normal in shape and position. Yolk sac:  Present. Embryo:  Not visualized. Embryonic Cardiac Activity: Not visualized. MSD: 5.9  mm   5 w   2  d        US EDC: 06/06/2016 Subchorionic hemorrhage:  None visualized. Maternal uterus/adnexae: Retroverted retroflexed uterus. Intramural/subserosal 1.3 x 1.5 x 1.6 cm anterior fundal fibroid. Anterior uterine body subserosal 1.6 x 1.4 x 1.5 cm fibroid. Left ovary measures 3.1 x 1.8 x 1.8 cm and contains a 1.8 cm corpus luteum. Right ovary measures 2.5 x 1.4 x 1.7 cm. No suspicious ovarian or adnexal masses. No abnormal free fluid in the pelvis. IMPRESSION: 1. Single  intrauterine gestational sac with yolk sac at 5 weeks 2 days by mean sac diameter. No embryo visualized, which could be due to early gestational age. No perigestational bleed. A follow-up obstetric scan is recommended in 11-14 days to reassess viability. 2. Small uterine fibroids as described. 3. No suspicious ovarian or adnexal masses. Electronically Signed   By: Delbert PhenixJason A Poff M.D.   On: 10/07/2015 13:09   [place] Ob Transvaginal  10/07/2015  CLINICAL DATA:  30 year old pregnant female with vaginal bleeding. Quantitative beta HCG pending. EDC by LMP: 06/12/2016, projecting to an expected gestational age of [redacted] weeks 3 days. EXAM: OBSTETRIC <14 WK US AND TRANSVAGINAL OB US TECHNIQUE: Both  transabdominal and transvaginal ultrasound examinations were performed for complete evaluation of the gestation as well as the maternal uterus, adnexal regions, and pelvic cul-de-sac. Transvaginal technique was performed to assess early pregnancy. COMPARISON:  No prior scans from this gestation. FINDINGS: Intrauterine gestational sac: Single intrauterine gestational sac appears normal in shape and position. Yolk sac:  Present. Embryo:  Not visualized. Embryonic Cardiac Activity: Not visualized. MSD: 5.9  mm   5 w   2  d        [place] EDC: 06/06/2016 Subchorionic hemorrhage:  None visualized. Maternal uterus/adnexae: Retroverted retroflexed uterus. Intramural/subserosal 1.3 x 1.5 x 1.6 cm anterior fundal fibroid. Anterior uterine body subserosal 1.6 x 1.4 x 1.5 cm fibroid. Left ovary measures 3.1 x 1.8 x 1.8 cm and contains a 1.8 cm corpus luteum. Right ovary measures 2.5 x 1.4 x 1.7 cm. No suspicious ovarian or adnexal masses. No abnormal free fluid in the pelvis. IMPRESSION: 1. Single intrauterine gestational sac with yolk sac at 5 weeks 2 days by mean sac diameter. No embryo visualized, which could be due to early gestational age. No perigestational bleed. A follow-up obstetric scan is recommended in 11-14 days to reassess viability. 2.  Small uterine fibroids as described. 3. No suspicious ovarian or adnexal masses. Electronically Signed   By: Delbert Phenix M.D.   On: 10/07/2015 13:09  GC/chlamydia pending   Assessment and Plan  Bleeding in pregnancy IUGS with YS [redacted]w[redacted]d- f/u with OB care- return sooner if bleeding or increase in pain BV- Flagyl  BID for 7 days Andreka Stucki 10/07/2015, 1:44 PM

## 2015-10-07 NOTE — Discharge Instructions (Signed)
Safe Medications in Pregnancy   Acne: Benzoyl Peroxide Salicylic Acid  Backache/Headache: Tylenol: 2 regular strength every 4 hours OR              2 Extra strength every 6 hours  Colds/Coughs/Allergies: Benadryl (alcohol free) 25 mg every 6 hours as needed Breath right strips Claritin Cepacol throat lozenges Chloraseptic throat spray Cold-Eeze- up to three times per day Cough drops, alcohol free Flonase (by prescription only) Guaifenesin Mucinex Robitussin DM (plain only, alcohol free) Saline nasal spray/drops Sudafed (pseudoephedrine) & Actifed ** use only after [redacted] weeks gestation and if you do not have high blood pressure Tylenol Vicks Vaporub Zinc lozenges Zyrtec   Constipation: Colace Ducolax suppositories Fleet enema Glycerin suppositories Metamucil Milk of magnesia Miralax Senokot Smooth move tea  Diarrhea: Kaopectate Imodium A-D  *NO pepto Bismol  Hemorrhoids: Anusol Anusol HC Preparation H Tucks  Indigestion: Tums Maalox Mylanta Zantac  Pepcid  Insomnia: Benadryl (alcohol free)  every 6 hours as needed Tylenol PM Unisom, no Gelcaps  Leg Cramps: Tums MagGel  Nausea/Vomiting:  Bonine Dramamine Emetrol Ginger extract Sea bands Meclizine  Nausea medication to take during pregnancy:  Unisom (doxylamine succinate 25 mg tablets) Take one tablet daily at bedtime. If symptoms are not adequately controlled, the dose can be increased to a maximum recommended dose of two tablets daily (1/2 tablet in the morning, 1/2 tablet mid-afternoon and one at bedtime). Vitamin B6  tablets. Take one tablet twice a day (up to 200 mg per day).  Skin Rashes: Aveeno products Benadryl cream or  every 6 hours as needed Calamine Lotion 1% cortisone cream  Yeast infection: Gyne-lotrimin 7 Monistat 7   **If taking multiple medications, please check labels to avoid duplicating the same active ingredients **take medication as directed on  the label ** Do not exceed 4000 mg of tylenol in 24 hours **Do not take medications that contain aspirin or ibuprofen   Vaginal Bleeding During Pregnancy, First Trimester A small amount of bleeding (spotting) from the vagina is common in early pregnancy. Sometimes the bleeding is normal and is not a problem, and sometimes it is a sign of something serious. Be sure to tell your doctor about any bleeding from your vagina right away. HOME CARE  Watch your condition for any changes.  Follow your doctor's instructions about how active you can be.  If you are on bed rest:  You may need to stay in bed and only get up to use the bathroom.  You may be allowed to do some activities.  If you need help, make plans for someone to help you.  Write down:  The number of pads you use each day.  How often you change pads.  How soaked (saturated) your pads are.  Do not use tampons.  Do not douche.  Do not have sex or orgasms until your doctor says it is okay.  If you pass any tissue from your vagina, save the tissue so you can show it to your doctor.  Only take medicines as told by your doctor.  Do not take aspirin because it can make you bleed.  Keep all follow-up visits as told by your doctor. GET HELP IF:   You bleed from your vagina.  You have cramps.  You have labor pains.  You have a fever that does not go away after you take medicine. GET HELP RIGHT AWAY IF:   You have very bad cramps in your back or belly (abdomen).  You pass large  clots or tissue from your vagina.  You bleed more.  You feel light-headed or weak.  You pass out (faint).  You have chills.  You are leaking fluid or have a gush of fluid from your vagina.  You pass out while pooping (having a bowel movement). MAKE SURE YOU:  Understand these instructions.  Will watch your condition.  Will get help right away if you are not doing well or get worse.   This information is not intended to  replace advice given to you by your health care provider. Make sure you discuss any questions you have with your health care provider.   Document Released: 09/16/2013 Document Reviewed: 09/16/2013 Elsevier Interactive Patient Education [employer]2016 Elsevier Inc.

## 2015-10-07 NOTE — MAU Note (Addendum)
Woke up at 0430, noted blood when used restroom, has since started cramping.  +HPT last weekend

## 2015-10-08 LAB — HIV ANTIBODY (ROUTINE TESTING W REFLEX): HIV SCREEN 4TH GENERATION: NONREACTIVE

## 2015-10-08 LAB — GC/CHLAMYDIA PROBE AMP (~~LOC~~) NOT AT ARMC
Chlamydia: NEGATIVE
NEISSERIA GONORRHEA: NEGATIVE

## 2015-10-15 ENCOUNTER — Encounter (HOSPITAL_COMMUNITY): Payer: Self-pay | Admitting: *Deleted

## 2015-10-15 ENCOUNTER — Inpatient Hospital Stay (HOSPITAL_COMMUNITY)
Admission: AD | Admit: 2015-10-15 | Discharge: 2015-10-15 | Disposition: A | Discharge status: Home / Self Care | Payer: Medicaid Other | Source: Ambulatory Visit | Attending: Obstetrics & Gynecology | Admitting: Obstetrics & Gynecology

## 2015-10-15 DIAGNOSIS — B373 Candidiasis of vulva and vagina: Secondary | ICD-10-CM

## 2015-10-15 DIAGNOSIS — F17210 Nicotine dependence, cigarettes, uncomplicated: Secondary | ICD-10-CM | POA: Diagnosis not present

## 2015-10-15 DIAGNOSIS — B3731 Acute candidiasis of vulva and vagina: Secondary | ICD-10-CM

## 2015-10-15 DIAGNOSIS — O98811 Other maternal infectious and parasitic diseases complicating pregnancy, first trimester: Secondary | ICD-10-CM | POA: Diagnosis not present

## 2015-10-15 DIAGNOSIS — Z3A01 Less than 8 weeks gestation of pregnancy: Secondary | ICD-10-CM

## 2015-10-15 DIAGNOSIS — N898 Other specified noninflammatory disorders of vagina: Secondary | ICD-10-CM | POA: Diagnosis present

## 2015-10-15 LAB — WET PREP, GENITAL
Clue Cells Wet Prep HPF POC: NONE SEEN
SPERM: NONE SEEN
TRICH WET PREP: NONE SEEN

## 2015-10-15 LAB — URINALYSIS, ROUTINE W REFLEX MICROSCOPIC
BILIRUBIN URINE: NEGATIVE
Glucose, UA: NEGATIVE mg/dL
Ketones, ur: NEGATIVE mg/dL
NITRITE: NEGATIVE
PROTEIN: NEGATIVE mg/dL
SPECIFIC GRAVITY, URINE: 1.015 (ref 1.005–1.030)
pH: 5 (ref 5.0–8.0)

## 2015-10-15 LAB — URINE MICROSCOPIC-ADD ON

## 2015-10-15 MED ORDER — TERCONAZOLE 0.8 % VA CREA: 1.0000 | TOPICAL_CREAM | Freq: Every day | VAGINAL | Status: DC

## 2015-10-15 NOTE — MAU Note (Signed)
Patient presents at [redacted] weeks gestation with c/o being treated for BV and finishing the medication but still having signs/symptoms. Denies bleeding but does note a discharge.

## 2015-10-15 NOTE — MAU Provider Note (Signed)
History     CSN: [id number]  Arrival date and time: 10/15/15 2035   First Provider Initiated Contact with Patient 10/15/15 2115       Chief Complaint  Patient presents with  . Vaginitis   HPI  Sonia Maxwell is a 30 y.o. G2P0010 at [redacted]w[redacted]d who presents with vaginal itching & discharge. Pt previously treated for BV; finished meds yesterday. States for the past 2 days has had vaginal itching & irritation with a thick white discharge. Denies any other symptoms. Plans on getting care with [facility]; has initial appt next week.  OB History    Gravida Para Term Preterm AB TAB SAB Ectopic Multiple Living   Past Medical History  Diagnosis Date  . Trichomonas contact, treated   . Infection     UTI  . Trichimoniasis     Past Surgical History  Procedure Laterality Date  . No past surgeries      Family History  Problem Relation Age of Onset  . Diabetes Mother   . Asthma Sister     Social History  Substance Use Topics  . Smoking status: Current Every Day Smoker -- 0.50 packs/day for 4 years    Types: Cigarettes, E-cigarettes  . Smokeless tobacco: Never Used     Comment: vape  . Alcohol Use: No    Allergies: No Known Allergies  Prescriptions prior to admission  Medication Sig Dispense Refill Last Dose  . metroNIDAZOLE (FLAGYL) 500 MG tablet Take 1 tablet (500 mg total) by mouth 2 (two) times daily. 14 tablet 0   . OVER THE COUNTER MEDICATION Take 1 tablet by mouth daily. Patient takes [facility] brand of Iron Supplement   10/07/2015 at Unknown time  . Prenatal Vit-Fe Fumarate-FA (PRENATAL MULTIVITAMIN) TABS tablet Take 1 tablet by mouth daily at 12 noon.   10/07/2015 at Unknown time    Review of Systems  Constitutional: Negative.   Gastrointestinal: Negative.   Genitourinary: Negative for dysuria.       + vaginal discharge + vaginal itching No vaginal bleeding   Physical Exam   Blood pressure 112/64, pulse 84, temperature 98.4 F (36.9 C),  temperature source Oral, resp. rate 20, height  (1.6 m), weight 124 lb 12 oz (56.586 kg), last menstrual period 09/06/2015, unknown if currently breastfeeding.  Physical Exam  Nursing note and vitals reviewed. Constitutional: She is oriented to person, place, and time. She appears well-developed and well-nourished. No distress.  HENT:  Head: Normocephalic and atraumatic.  Eyes: Conjunctivae are normal. Right eye exhibits no discharge. Left eye exhibits no discharge. No scleral icterus.  Neck: Normal range of motion.  Cardiovascular: Normal rate, regular rhythm and normal heart sounds.   No murmur heard. Respiratory: Effort normal and breath sounds normal. No respiratory distress. She has no wheezes.  Genitourinary: There is erythema in the vagina. Vaginal discharge (small amount of clumpy white discharge) found.  Neurological: She is alert and oriented to person, place, and time.  Skin: Skin is warm and dry. She is not diaphoretic.  Psychiatric: She has a normal mood and affect. Her behavior is normal. Judgment and thought content normal.    MAU Course  Procedures Results for orders placed or performed during the hospital encounter of 10/15/15 (from the past 24 hour(s))  Urinalysis, Routine w reflex microscopic (not at [facility])     Status: Abnormal   Collection Time: 10/15/15  8:56 PM  Result Value Ref Range   Color, Urine YELLOW YELLOW   APPearance CLEAR CLEAR   Specific Gravity, Urine 1.015 1.005 - 1.030   pH 5.0 5.0 - 8.0   Glucose, UA NEGATIVE NEGATIVE mg/dL   Hgb urine dipstick TRACE (A) NEGATIVE   Bilirubin Urine NEGATIVE NEGATIVE   Ketones, ur NEGATIVE NEGATIVE mg/dL   Protein, ur NEGATIVE NEGATIVE mg/dL   Nitrite NEGATIVE NEGATIVE   Leukocytes, UA SMALL (A) NEGATIVE  Urine microscopic-add on     Status: Abnormal   Collection Time: 10/15/15  8:56 PM  Result Value Ref Range   Squamous Epithelial / LPF 0-5 (A) NONE SEEN   WBC, UA 0-5 0 - 5 WBC/hpf   RBC / HPF 0-5 0 - 5  RBC/hpf   Bacteria, UA RARE (A) NONE SEEN   Urine-Other YEAST PRESENT   Wet prep, genital     Status: Abnormal   Collection Time: 10/15/15  9:21 PM  Result Value Ref Range   Yeast Wet Prep HPF POC PRESENT (A) NONE SEEN   Trich, Wet Prep NONE SEEN NONE SEEN   Clue Cells Wet Prep HPF POC NONE SEEN NONE SEEN   WBC, Wet Prep HPF POC MANY (A) NONE SEEN   Sperm NONE SEEN    MDM Will tx for yeast based on exam & results  Assessment and Plan  A: 1. Vaginal yeast infection     P: Discharge home Rx terazol Discussed reasons to return to MAU Keep ob appt  Sonia Maxwell 10/15/2015, 9:15 PM

## 2015-10-15 NOTE — Discharge Instructions (Signed)

## 2015-11-10 LAB — OB RESULTS CONSOLE ANTIBODY SCREEN: Antibody Screen: NEGATIVE

## 2015-11-10 LAB — OB RESULTS CONSOLE GC/CHLAMYDIA
Chlamydia: NEGATIVE
Gonorrhea: NEGATIVE

## 2015-11-10 LAB — OB RESULTS CONSOLE ABO/RH: RH TYPE: POSITIVE

## 2015-11-10 LAB — OB RESULTS CONSOLE HIV ANTIBODY (ROUTINE TESTING): HIV: NONREACTIVE

## 2015-11-10 LAB — OB RESULTS CONSOLE RPR: RPR: NONREACTIVE

## 2015-11-10 LAB — OB RESULTS CONSOLE RUBELLA ANTIBODY, IGM: Rubella: IMMUNE

## 2015-11-10 LAB — OB RESULTS CONSOLE HEPATITIS B SURFACE ANTIGEN: HEP B S AG: NEGATIVE

## 2016-01-29 ENCOUNTER — Inpatient Hospital Stay (HOSPITAL_COMMUNITY)
Admission: AD | Admit: 2016-01-29 | Payer: Medicaid Other | Source: Ambulatory Visit | Admitting: Obstetrics and Gynecology

## 2016-05-03 LAB — OB RESULTS CONSOLE GBS: STREP GROUP B AG: NEGATIVE

## 2016-05-16 NOTE — L&D Delivery Note (Addendum)
Delivery Note Pt complete. Pushed for about 30mins and at 6:21 PM a viable female was delivered via Vaginal, Spontaneous Delivery (Presentation:LOA ;  ).  APGAR: 9, 9; weight pending .   Placenta status:delivered, intact, shultz , .  Cord:3vc  with the following complications: .  Cord pH: n/a  Anesthesia:  Epidural Episiotomy: None Lacerations: 1st degree;Periurethral Suture Repair: 4-0 vicryl Est. Blood Loss (mL):  250ml  Mom to postpartum.  Baby to Couplet care / Skin to Skin. Plans to have circumcision in office  [facility] VailCecilia Worema Banga 06/08/2016, 6:42 PM

## 2016-06-03 ENCOUNTER — Encounter (HOSPITAL_COMMUNITY): Payer: Self-pay | Admitting: *Deleted

## 2016-06-03 ENCOUNTER — Telehealth (HOSPITAL_COMMUNITY): Payer: Self-pay | Admitting: *Deleted

## 2016-06-03 NOTE — Telephone Encounter (Signed)
Preadmission screen  

## 2016-06-06 NOTE — H&P (Deleted)
  The note originally documented on this encounter has been moved the the encounter in which it belongs.  

## 2016-06-06 NOTE — H&P (Addendum)
Sonia Maxwell is a 31 y.o. female presenting for scheduled iol for postdates. Pt begun prenatal care at [redacted]weeks gestation with dating confirmed via US. She had negative first trimester screen and essential panel. She has had benign prenatal care otherwise. Pt has had intermittent headaches lately but normal workup for preE and BP stable.   OB History    Gravida Para Term Preterm AB Living   2       1     SAB TAB Ectopic Multiple Live Births   1             Past Medical History:  Diagnosis Date  . Infection    UTI  . Trichimoniasis   . Trichomonas contact, treated    Past Surgical History:  Procedure Laterality Date  . NO PAST SURGERIES     Family History: family history includes Asthma in her sister; Diabetes in her mother. Social History:  reports that she has been smoking Cigarettes and E-cigarettes.  She has a 2.00 pack-year smoking history. She has never used smokeless tobacco. She reports that she does not drink alcohol or use drugs.     Maternal Diabetes: No Genetic Screening: Normal Maternal Ultrasounds/Referrals: Normal Fetal Ultrasounds or other Referrals:  None Maternal Substance Abuse:  No Significant Maternal Medications:  None Significant Maternal Lab Results:  Lab values include: Group B Strep negative Other Comments:  None  Review of Systems  Constitutional: Positive for malaise/fatigue. Negative for chills and fever.  HENT: Negative for hearing loss.   Eyes: Negative for blurred vision and double vision.  Respiratory: Negative for shortness of breath.   Cardiovascular: Negative for chest pain.  Gastrointestinal: Positive for abdominal pain. Negative for heartburn, nausea and vomiting.  Genitourinary: Negative for dysuria.  Musculoskeletal: Positive for back pain and joint pain. Negative for myalgias.  Skin: Negative for itching and rash.  Neurological: Positive for headaches. Negative for dizziness.  Endo/Heme/Allergies: Does not bruise/bleed easily.   Psychiatric/Behavioral: Negative for depression, hallucinations, substance abuse and suicidal ideas. The patient is not nervous/anxious.    Maternal Medical History:  Reason for admission: Contractions.  Nausea.  Contractions: Onset was more than 2 days ago.   Frequency: rare.   Perceived severity is mild.    Fetal activity: Perceived fetal activity is normal.   Last perceived fetal movement was within the past hour.    Prenatal complications: no prenatal complications Prenatal Complications - Diabetes: none.      Last menstrual period 09/06/2015, unknown if currently breastfeeding. Maternal Exam:  Uterine Assessment: Contraction strength is mild.  Contraction frequency is rare.   Abdomen: Patient reports generalized tenderness.  Estimated fetal weight is AGA.   Fetal presentation: vertex  Introitus: Vulva is negative for condylomata and lesion.  Normal vagina.  Vagina is negative for condylomata.  Pelvis: adequate for delivery.   Cervix: Cervix evaluated by digital exam.     Physical Exam  Constitutional: She is oriented to person, place, and time. She appears well-developed.  Cardiovascular: Normal rate.   Respiratory: Effort normal.  GI: Soft. There is generalized tenderness.  Genitourinary: Vagina normal and uterus normal. Vulva exhibits no lesion.  Neurological: She is alert and oriented to person, place, and time.  Skin: Skin is warm.  Psychiatric: She has a normal mood and affect. Her behavior is normal. Judgment and thought content normal.    Prenatal labs: ABO, Rh: B/Positive/-- (06/27 0000) Antibody: Negative (06/27 0000) Rubella: Immune (06/27 0000) RPR: Nonreactive (06/27 0000)  HBsAg: Negative (06/27  0000)  HIV: Non-reactive (06/27 0000)  GBS: Negative (12/19 0000)   Assessment/Plan: G2P0010 at 40 2/[redacted]wks gestation for iol - stable GBS neg Cytotec for cervical ripening then pit/AROM to follow Pain control prn  Sonia Maxwell 06/06/2016,  4:55 PM

## 2016-06-07 ENCOUNTER — Inpatient Hospital Stay (HOSPITAL_COMMUNITY)
Admission: RE | Admit: 2016-06-07 | Discharge: 2016-06-07 | Disposition: A | Discharge status: Home / Self Care | Payer: Medicaid Other | Source: Ambulatory Visit | Attending: Obstetrics and Gynecology | Admitting: Obstetrics and Gynecology

## 2016-06-08 ENCOUNTER — Encounter (HOSPITAL_COMMUNITY): Payer: Self-pay

## 2016-06-08 ENCOUNTER — Inpatient Hospital Stay (HOSPITAL_COMMUNITY): Payer: Medicaid Other | Admitting: Anesthesiology

## 2016-06-08 ENCOUNTER — Inpatient Hospital Stay (HOSPITAL_COMMUNITY)
Admission: RE | Admit: 2016-06-08 | Discharge: 2016-06-10 | DRG: 775 | Disposition: A | Discharge status: Home / Self Care | Payer: Medicaid Other | Source: Ambulatory Visit | Attending: Obstetrics and Gynecology | Admitting: Obstetrics and Gynecology

## 2016-06-08 DIAGNOSIS — Z3A40 40 weeks gestation of pregnancy: Secondary | ICD-10-CM

## 2016-06-08 DIAGNOSIS — Z3493 Encounter for supervision of normal pregnancy, unspecified, third trimester: Secondary | ICD-10-CM | POA: Diagnosis present

## 2016-06-08 DIAGNOSIS — Z3490 Encounter for supervision of normal pregnancy, unspecified, unspecified trimester: Secondary | ICD-10-CM

## 2016-06-08 LAB — TYPE AND SCREEN
ABO/RH(D): B POS
ANTIBODY SCREEN: NEGATIVE

## 2016-06-08 LAB — CBC
HEMATOCRIT: 36 % (ref 36.0–46.0)
Hemoglobin: 12.3 g/dL (ref 12.0–15.0)
MCH: 27.2 pg (ref 26.0–34.0)
MCHC: 34.2 g/dL (ref 30.0–36.0)
MCV: 79.5 fL (ref 78.0–100.0)
PLATELETS: 268 10*3/uL (ref 150–400)
RBC: 4.53 MIL/uL (ref 3.87–5.11)
RDW: 16.3 % — AB (ref 11.5–15.5)
WBC: 7.2 10*3/uL (ref 4.0–10.5)

## 2016-06-08 LAB — RPR: RPR Ser Ql: NONREACTIVE

## 2016-06-08 MED ORDER — EPHEDRINE 5 MG/ML INJ
10.0000 mg | INTRAVENOUS | Status: DC | PRN
Start: 2016-06-08 — End: 2016-06-08

## 2016-06-08 MED ORDER — IBUPROFEN 600 MG PO TABS
600.0000 mg | ORAL_TABLET | Freq: Four times a day (QID) | ORAL | Status: DC
  Administered 2016-06-08 – 2016-06-10 (×7): 600 mg via ORAL
  Filled 2016-06-08 (×7): qty 1

## 2016-06-08 MED ORDER — PHENYLEPHRINE 40 MCG/ML (10ML) SYRINGE FOR IV PUSH (FOR BLOOD PRESSURE SUPPORT)
PREFILLED_SYRINGE | INTRAVENOUS | Status: AC
  Filled 2016-06-08: qty 10

## 2016-06-08 MED ORDER — PRENATAL MULTIVITAMIN CH
1.0000 | ORAL_TABLET | Freq: Every day | ORAL | Status: DC
  Administered 2016-06-09 – 2016-06-10 (×2): 1 via ORAL
  Filled 2016-06-08 (×2): qty 1

## 2016-06-08 MED ORDER — TERBUTALINE SULFATE 1 MG/ML IJ SOLN
0.2500 mg | Freq: Once | INTRAMUSCULAR | Status: AC | PRN
  Administered 2016-06-08: 0.25 mg via SUBCUTANEOUS
  Filled 2016-06-08: qty 1

## 2016-06-08 MED ORDER — LACTATED RINGERS IV SOLN: INTRAVENOUS | Status: DC

## 2016-06-08 MED ORDER — FENTANYL 2.5 MCG/ML BUPIVACAINE 1/10 % EPIDURAL INFUSION (WH - ANES)
14.0000 mL/h | INTRAMUSCULAR | Status: DC | PRN
  Administered 2016-06-08: 14 mL/h via EPIDURAL
  Filled 2016-06-08: qty 100

## 2016-06-08 MED ORDER — DIPHENHYDRAMINE HCL 25 MG PO CAPS: 25.0000 mg | ORAL_CAPSULE | Freq: Four times a day (QID) | ORAL | Status: DC | PRN

## 2016-06-08 MED ORDER — LIDOCAINE HCL (PF) 1 % IJ SOLN
INTRAMUSCULAR | Status: DC | PRN
  Administered 2016-06-08: 4 mL via EPIDURAL

## 2016-06-08 MED ORDER — OXYCODONE-ACETAMINOPHEN 5-325 MG PO TABS: 2.0000 | ORAL_TABLET | ORAL | Status: DC | PRN

## 2016-06-08 MED ORDER — ACETAMINOPHEN 325 MG PO TABS: 650.0000 mg | ORAL_TABLET | ORAL | Status: DC | PRN

## 2016-06-08 MED ORDER — SOD CITRATE-CITRIC ACID 500-334 MG/5ML PO SOLN
30.0000 mL | ORAL | Status: DC | PRN
  Filled 2016-06-08: qty 15

## 2016-06-08 MED ORDER — EPHEDRINE 5 MG/ML INJ: 10.0000 mg | INTRAVENOUS | Status: DC | PRN

## 2016-06-08 MED ORDER — DIBUCAINE 1 % RE OINT: 1.0000 "application " | TOPICAL_OINTMENT | RECTAL | Status: DC | PRN

## 2016-06-08 MED ORDER — COCONUT OIL OIL
1.0000 "application " | TOPICAL_OIL | Status: DC | PRN
  Administered 2016-06-10: 1 via TOPICAL
  Filled 2016-06-08: qty 120

## 2016-06-08 MED ORDER — PHENYLEPHRINE 40 MCG/ML (10ML) SYRINGE FOR IV PUSH (FOR BLOOD PRESSURE SUPPORT): 80.0000 ug | PREFILLED_SYRINGE | INTRAVENOUS | Status: DC | PRN

## 2016-06-08 MED ORDER — LACTATED RINGERS IV SOLN
INTRAVENOUS | Status: DC
  Administered 2016-06-08 (×3): via INTRAVENOUS

## 2016-06-08 MED ORDER — OXYCODONE-ACETAMINOPHEN 5-325 MG PO TABS: 1.0000 | ORAL_TABLET | ORAL | Status: DC | PRN

## 2016-06-08 MED ORDER — SENNOSIDES-DOCUSATE SODIUM 8.6-50 MG PO TABS
2.0000 | ORAL_TABLET | ORAL | Status: DC
  Administered 2016-06-08 – 2016-06-09 (×2): 2 via ORAL
  Filled 2016-06-08 (×2): qty 2

## 2016-06-08 MED ORDER — DIPHENHYDRAMINE HCL 50 MG/ML IJ SOLN: 12.5000 mg | INTRAMUSCULAR | Status: DC | PRN

## 2016-06-08 MED ORDER — SIMETHICONE 80 MG PO CHEW: 80.0000 mg | CHEWABLE_TABLET | ORAL | Status: DC | PRN

## 2016-06-08 MED ORDER — WITCH HAZEL-GLYCERIN EX PADS: 1.0000 "application " | MEDICATED_PAD | CUTANEOUS | Status: DC | PRN

## 2016-06-08 MED ORDER — ONDANSETRON HCL 4 MG PO TABS: 4.0000 mg | ORAL_TABLET | ORAL | Status: DC | PRN

## 2016-06-08 MED ORDER — OXYTOCIN BOLUS FROM INFUSION: 500.0000 mL | Freq: Once | INTRAVENOUS | Status: DC

## 2016-06-08 MED ORDER — ZOLPIDEM TARTRATE 5 MG PO TABS: 5.0000 mg | ORAL_TABLET | Freq: Every evening | ORAL | Status: DC | PRN

## 2016-06-08 MED ORDER — OXYTOCIN 40 UNITS IN LACTATED RINGERS INFUSION - SIMPLE MED: 2.5000 [IU]/h | INTRAVENOUS | Status: DC

## 2016-06-08 MED ORDER — BENZOCAINE-MENTHOL 20-0.5 % EX AERO
1.0000 "application " | INHALATION_SPRAY | CUTANEOUS | Status: DC | PRN
  Administered 2016-06-08: 1 via TOPICAL
  Filled 2016-06-08: qty 56

## 2016-06-08 MED ORDER — ONDANSETRON HCL 4 MG/2ML IJ SOLN: 4.0000 mg | INTRAMUSCULAR | Status: DC | PRN

## 2016-06-08 MED ORDER — TETANUS-DIPHTH-ACELL PERTUSSIS 5-2.5-18.5 LF-MCG/0.5 IM SUSP
0.5000 mL | Freq: Once | INTRAMUSCULAR | Status: DC
  Filled 2016-06-08: qty 0.5

## 2016-06-08 MED ORDER — LACTATED RINGERS IV SOLN: 500.0000 mL | Freq: Once | INTRAVENOUS | Status: DC

## 2016-06-08 MED ORDER — ONDANSETRON HCL 4 MG/2ML IJ SOLN: 4.0000 mg | Freq: Four times a day (QID) | INTRAMUSCULAR | Status: DC | PRN

## 2016-06-08 MED ORDER — BUTORPHANOL TARTRATE 1 MG/ML IJ SOLN: 1.0000 mg | INTRAMUSCULAR | Status: DC | PRN

## 2016-06-08 MED ORDER — LACTATED RINGERS IV SOLN
500.0000 mL | INTRAVENOUS | Status: DC | PRN
  Administered 2016-06-08: 500 mL via INTRAVENOUS

## 2016-06-08 MED ORDER — LIDOCAINE HCL (PF) 1 % IJ SOLN: 30.0000 mL | INTRAMUSCULAR | Status: DC | PRN

## 2016-06-08 MED ORDER — OXYTOCIN 40 UNITS IN LACTATED RINGERS INFUSION - SIMPLE MED
1.0000 m[IU]/min | INTRAVENOUS | Status: DC
  Administered 2016-06-08 (×2): 2 m[IU]/min via INTRAVENOUS
  Filled 2016-06-08: qty 1000

## 2016-06-08 MED ORDER — FENTANYL 2.5 MCG/ML BUPIVACAINE 1/10 % EPIDURAL INFUSION (WH - ANES)
INTRAMUSCULAR | Status: AC
  Filled 2016-06-08: qty 100

## 2016-06-08 MED ORDER — MISOPROSTOL 25 MCG QUARTER TABLET
25.0000 ug | ORAL_TABLET | ORAL | Status: DC | PRN
  Administered 2016-06-08: 25 ug via VAGINAL
  Filled 2016-06-08: qty 0.25

## 2016-06-08 NOTE — Progress Notes (Signed)
Patient ID: Sonia Maxwell, female   DOB: [birth year], 31 y.o.   MRN: [id number] Pt still comfortable with epidural Recurrent late decels noted with contractions q 1-543mins SVE - 5/90/-2  A/P: FSE placed         Amnioinfusion started         Pitocin stopped with no resolution following start on amnioinfusion         Continue to monitor/position changes

## 2016-06-08 NOTE — Progress Notes (Signed)
Patient ID: Sonia Maxwell, female   DOB: 06-03-1985, 31 y.o.   MRN: [id number] Foley bulb out Cervix 3/90/-2 AROM performed with moderate return of light meconium stained fluid IUPC placed Pain control prn Anticipate svd

## 2016-06-08 NOTE — Progress Notes (Signed)
Patient ID: Sonia Maxwell, female   DOB: 05/25/1985, 31 y.o.   MRN: [id number] FHTs noted to drop into 80s for 1-4 mins on cytotec overnight. Fluid bolus was given and terbutaline as well.  Pt still appreciating contractions at this time EFM - 145, cat 1 TOCO - contractions q 1-153mins SVE - 1/90/-2  A/P: G2P0010 at 402/[redacted]wks gestation - stable         Cooks foley catheter placed with 60u/40v          Will recheck prn; consider pitocin if able          AROM when able          Anticipate svd          Pain control prn

## 2016-06-08 NOTE — Anesthesia Procedure Notes (Signed)
Epidural Patient location during procedure: OB Start time: 06/08/2016 10:21 AM End time: 06/08/2016 10:27 AM  Staffing Anesthesiologist: Shona SimpsonHOLLIS, Sonia Colbaugh D Performed: anesthesiologist   Preanesthetic Checklist Completed: patient identified, site marked, surgical consent, pre-op evaluation, timeout performed, IV checked, risks and benefits discussed and monitors and equipment checked  Epidural Patient position: sitting Prep: ChloraPrep Patient monitoring: heart rate, continuous pulse ox and blood pressure Approach: midline Location: L3-L4 Injection technique: LOR saline  Needle:  Needle type: Tuohy  Needle gauge: 17 G Needle length: 9 cm Catheter type: closed end flexible Catheter size: 20 Guage Test dose: negative and 1.5% lidocaine  Assessment Events: blood not aspirated, injection not painful, no injection resistance and no paresthesia  Additional Notes LOR @ 4.5  Patient identified. Risks/Benefits/Options discussed with patient including but not limited to bleeding, infection, nerve damage, paralysis, failed block, incomplete pain control, headache, blood pressure changes, nausea, vomiting, reactions to medications, itching and postpartum back pain. Confirmed with bedside nurse the patient's most recent platelet count. Confirmed with patient that they are not currently taking any anticoagulation, have any bleeding history or any family history of bleeding disorders. Patient expressed understanding and wished to proceed. All questions were answered. Sterile technique was used throughout the entire procedure. Please see nursing notes for vital signs. Test dose was given through epidural catheter and negative prior to continuing to dose epidural or start infusion. Warning signs of high block given to the patient including shortness of breath, tingling/numbness in hands, complete motor block, or any concerning symptoms with instructions to call for help. Patient was given instructions on  fall risk and not to get out of bed. All questions and concerns addressed with instructions to call with any issues or inadequate analgesia.    Reason for block:procedure for pain

## 2016-06-08 NOTE — Lactation Note (Signed)
This note was copied from a baby's chart. Lactation Consultation Note Initial visit at 5 hours of age.  Baby is 7718w2d at 5#4oz.  Lc arrived as lab was checking glucose levels.  Baby previously had a 38 and now up to 46.  RN assisted with previous feeding attempt of a few sucks. LC assisted mom with hand expression.  Mom has large round full breast with everted nipples and compressible breast tissue. Mom is able to return demonstration of hand expression.  After several minutes of alternating breast few drops expressed and wiped to baby's mouth with gloved finger.  Baby asleep on mom STS.  Baby did suck a little on gloved finger.  Lc assisted with cross cradle hold on right breast.  Baby latched well with wide gape and with some stimulation began sucking strong and rhythmic for 12 minutes with few audible swallows.   LC discussed with mom possible behavior of LBW baby and mom to call Rn for assist if baby is not feeding well at least every 3 hours.  Discussed with mom she may need to use DEBP for stimulation and offer supplementation to support baby'. Lc discussed with mom option of spoon feeding if she can hand express a few drops.  MOm plans to breast feed for 6 months so LC discussed benefits of delaying bottle nipples.    LC reported to RN, [facility] Maxwell. [facility] given and discussed.  Encouraged to feed with early cues on demand.  Early newborn behavior discussed.  Mom to call for assist as needed.     Patient Name: Sonia Maxwell WUJWJ'XToday's Date: 06/08/2016 Reason for consult: Initial assessment;Infant < 6lbs   Maternal Data Has patient been taught Hand Expression?: Yes (LC reviewed technique)  Feeding Feeding Type: Breast Fed Length of feed: 12 min  LATCH Score/Interventions Latch: Grasps breast easily, tongue down, lips flanged, rhythmical sucking.  Audible Swallowing: A few with stimulation Intervention(s): Skin to skin;Hand expression;Alternate breast massage  Type of  Nipple: Everted at rest and after stimulation  Comfort (Breast/Nipple): Soft / non-tender     Hold (Positioning): Assistance needed to correctly position infant at breast and maintain latch. Intervention(s): Breastfeeding basics reviewed;Support Pillows;Position options;Skin to skin  LATCH Score: 8  Lactation Tools Discussed/Used WIC Program: Yes   Consult Status Consult Status: Follow-up Date: 06/09/16 Follow-up type: In-patient    Sonia Maxwell, Sonia Maxwell 06/08/2016, 11:25 PM

## 2016-06-08 NOTE — Anesthesia Pain Management Evaluation Note (Signed)
  CRNA Pain Management Visit Note  Patient: Sonia HarperDeBorah Bordwell, 31 y.o., female  "Hello I am a member of the anesthesia team at [facility]. We have an anesthesia team available at all times to provide care throughout the hospital, including epidural management and anesthesia for C-section. I don't know your plan for the delivery whether it a natural birth, water birth, IV sedation, nitrous supplementation, doula or epidural, but we want to meet your pain goals."   1.Was your pain managed to your expectations on prior hospitalizations?   No prior hospitalizations  2.What is your expectation for pain management during this hospitalization?     Epidural  3.How can we help you reach that goal? epidural  Record the patient's initial score and the patient's pain goal.   Pain: 0  Pain Goal: 10 [facility] wants you to be able to say your pain was always managed very well.  Fransico Sciandra 06/08/2016

## 2016-06-08 NOTE — Anesthesia Preprocedure Evaluation (Signed)
Anesthesia Evaluation  Patient identified by MRN, date of birth, ID band Patient awake    Reviewed: Allergy & Precautions, NPO status , Patient's Chart, lab work & pertinent test results  Airway Mallampati: I  TM Distance: >3 FB Neck ROM: Full    Dental  (+) Teeth Intact, Dental Advisory Given   Pulmonary former smoker,    breath sounds clear to auscultation       Cardiovascular negative cardio ROS   Rhythm:Regular Rate:Normal     Neuro/Psych negative neurological ROS  negative psych ROS   GI/Hepatic negative GI ROS, Neg liver ROS,   Endo/Other  negative endocrine ROS  Renal/GU negative Renal ROS  negative genitourinary   Musculoskeletal negative musculoskeletal ROS (+)   Abdominal   Peds negative pediatric ROS (+)  Hematology negative hematology ROS (+)   Anesthesia Other Findings   Reproductive/Obstetrics (+) Pregnancy                             Lab Results  Component Value Date   WBC 7.2 06/08/2016   HGB 12.3 06/08/2016   HCT 36.0 06/08/2016   MCV 79.5 06/08/2016   PLT 268 06/08/2016   No results found for: INR, PROTIME   Anesthesia Physical Anesthesia Plan  ASA: II  Anesthesia Plan: Epidural   Post-op Pain Management:    Induction:   Airway Management Planned:   Additional Equipment:   Intra-op Plan:   Post-operative Plan:   Informed Consent: I have reviewed the patients History and Physical, chart, labs and discussed the procedure including the risks, benefits and alternatives for the proposed anesthesia with the patient or authorized representative who has indicated his/her understanding and acceptance.     Plan Discussed with:   Anesthesia Plan Comments:         Anesthesia Quick Evaluation

## 2016-06-08 NOTE — Progress Notes (Signed)
Patient ID: Sonia HarperDeBorah Maxwell, female   DOB: Sep 17, 1985, 31 y.o.   MRN: [id number] Pt with no complaints VSS CAT 1, 130 ctxs q 1-573mins SVE 7-8cm  A/P: G2P0010 progressing well - stable         Expectant mgmt

## 2016-06-08 NOTE — Progress Notes (Signed)
Patient ID: Sonia HarperDeBorah Sonia Maxwell, female   DOB: [birth year], 31 y.o.   MRN: [id number] Pt comfortable with epidural. Has no complaints VSS CAT 1 Contractions q 1-285mins SVE 4/90/-2 MVUs at 160  A/P: G2P0010 at term - inadequate mvus         Will start pitocin for augmentation         Anticipate svd

## 2016-06-09 LAB — CBC
HEMATOCRIT: 29.7 % — AB (ref 36.0–46.0)
Hemoglobin: 10.3 g/dL — ABNORMAL LOW (ref 12.0–15.0)
MCH: 27.7 pg (ref 26.0–34.0)
MCHC: 34.7 g/dL (ref 30.0–36.0)
MCV: 79.8 fL (ref 78.0–100.0)
PLATELETS: 198 10*3/uL (ref 150–400)
RBC: 3.72 MIL/uL — ABNORMAL LOW (ref 3.87–5.11)
RDW: 16.6 % — AB (ref 11.5–15.5)
WBC: 9.7 10*3/uL (ref 4.0–10.5)

## 2016-06-09 MED ORDER — OXYCODONE HCL 5 MG PO TABS
5.0000 mg | ORAL_TABLET | ORAL | Status: DC | PRN
  Administered 2016-06-09 – 2016-06-10 (×2): 5 mg via ORAL
  Filled 2016-06-09 (×2): qty 1

## 2016-06-09 NOTE — Lactation Note (Signed)
This note was copied from a baby's chart. Lactation Consultation Note: Infant is 2314 hours old. I was paged to assist mother with latching infant. Infant swaddled and sleeping when I entered room. Mother made comment , "I just want to bottle feed." Dr Ellyn HackBovard in room and told mother to try and tuff it out. Informed mother that since infant is small he may need to be supplemented with a bottle. Mother states she wanted to try.   Assistance with unwraping infant and placing infant skin to skin. Baby sit ups until infant roused and opened his eyes. Infant placed in cross cradle hold. Mother had several drops of colostrum on tip of her nipple. Infant quickly opened his mouth. Infant latched on for 13 mins. Observed good strong tugging and a few swallows. Mother states this is the first time infant has really breastfed this good.   Infant was finger fed 5 ml of Neosure. Suggested that mother supplement infant after each breastfeeding. Bottle nipples are in the room if mother wants to use a nipple. Mother was given supplemental guidelines. A DEBP was sat up and assisted mother with pumping. Advised mother to pump every 2-3 hours for 15 mins . Lots of teaching with mother . Mother receptive to all teaching. Mother advised to ask for breastfeeding assistance from nursing staff or [facility] as needed.      Patient Name: Boy Shannan HarperDeBorah Bhatt ZOXWR'UToday's Date: 06/09/2016 Reason for consult: Follow-up assessment   Maternal Data    Feeding Feeding Type: Breast Fed Length of feed: 13 min  LATCH Score/Interventions                      Lactation Tools Discussed/Used     Consult Status      Stevan BornKendrick, Aryssa Rosamond McCoy 06/09/2016, 10:12 AM

## 2016-06-09 NOTE — Progress Notes (Signed)
Post Partum Day 1 Subjective: no complaints, up ad lib, voiding, tolerating PO and nl lochia, pain controlled  Objective: Blood pressure 118/69, pulse 90, temperature 98.7 F (37.1 C), resp. rate 20, height 5\' 3"  (1.6 m), weight 76.4 kg (168 lb 8 oz), last menstrual period 09/06/2015, SpO2 99 %, unknown if currently breastfeeding.  Physical Exam:  General: alert and no distress Lochia: appropriate Uterine Fundus: firm   Recent Labs  06/08/16 0110 06/09/16 0647  HGB 12.3 10.3*  HCT 36.0 29.7*    Assessment/Plan: Plan for discharge tomorrow.  Routine PP care   LOS: 1 day   Bovard-Stuckert, Sonia Maxwell 06/09/2016, 8:35 AM

## 2016-06-09 NOTE — Anesthesia Postprocedure Evaluation (Signed)
Anesthesia Post Note  Patient: Sonia Maxwell  Procedure(s) Performed: * No procedures listed *  Patient location during evaluation: Mother Baby Anesthesia Type: Epidural Level of consciousness: awake Pain management: satisfactory to patient Vital Signs Assessment: post-procedure vital signs reviewed and stable Respiratory status: spontaneous breathing Cardiovascular status: stable Anesthetic complications: no        Last Vitals:  Vitals:   06/08/16 2200 06/09/16 0200  BP: 118/69 118/69  Pulse: 90 90  Resp: 18 20  Temp: 37.1 C     Last Pain:  Vitals:   06/08/16 1933  TempSrc: Oral  PainSc:    Pain Goal:                 KeyCorpBURGER,Chelsey Kimberley

## 2016-06-10 MED ORDER — IBUPROFEN 600 MG PO TABS: 600.0000 mg | ORAL_TABLET | Freq: Four times a day (QID) | ORAL | 0 refills | Status: AC

## 2016-06-10 MED ORDER — OXYCODONE HCL 5 MG PO TABS: 5.0000 mg | ORAL_TABLET | ORAL | 0 refills | Status: AC | PRN

## 2016-06-10 NOTE — Progress Notes (Signed)
Post Partum Day 2 Subjective: no complaints, up ad lib and tolerating PO  Objective: Blood pressure 114/76, pulse 83, temperature 98.4 F (36.9 C), temperature source Oral, resp. rate 18, height 5\' 3"  (1.6 m), weight 76.4 kg (168 lb 8 oz), last menstrual period 09/06/2015, SpO2 98 %, unknown if currently breastfeeding.  Physical Exam:  General: alert and cooperative Lochia: appropriate Uterine Fundus: firm    Recent Labs  06/08/16 0110 06/09/16 0647  HGB 12.3 10.3*  HCT 36.0 29.7*    Assessment/Plan: Discharge home, baby small but per mother eating well. D/w her rooming in if pediatrician has concerns   LOS: 2 days   Heatherly Stenner W 06/10/2016, 9:09 AM

## 2016-06-10 NOTE — Lactation Note (Signed)
This note was copied from a baby's chart. Lactation Consultation Note  Patient Name: Sonia Shannan HarperDeBorah Ridener ZOXWR'UToday's Date: 06/10/2016 Reason for consult: Follow-up assessment;Infant weight loss (6% weight loss , < 5 pounds ) Baby is 7441 hours old , baby latched well , initially mom felt some discomfort and with breast compressions per mom comfortable . Baby fed 25 mins , multiple swallows noted.    Maternal Data Does the patient have breastfeeding experience prior to this delivery?: No  Feeding Feeding Type: Breast Fed Length of feed:  (per mom , did not latch )  LATCH Score/Interventions Latch: Grasps breast easily, tongue down, lips flanged, rhythmical sucking. Intervention(s): Adjust position;Assist with latch;Breast massage;Breast compression  Audible Swallowing: Spontaneous and intermittent  Type of Nipple: Everted at rest and after stimulation  Comfort (Breast/Nipple): Filling, red/small blisters or bruises, mild/mod discomfort  Problem noted: Mild/Moderate discomfort  Hold (Positioning): Assistance needed to correctly position infant at breast and maintain latch. Intervention(s): Breastfeeding basics reviewed;Support Pillows;Position options;Skin to skin  LATCH Score: 8  Lactation Tools Discussed/Used Tools: [place] WIC Program: Yes (per mom active - [place] )   Consult Status Consult Status: Follow-up Date: 06/10/16 Follow-up type: In-patient    Matilde SprangMargaret Ann Anastasios Melander 06/10/2016, 11:28 AM

## 2016-06-10 NOTE — Discharge Summary (Signed)
OB Discharge Summary     Patient Name: Sonia Maxwell DOB: [birth year] MRN: [id number]  Date of admission: 06/08/2016 Delivering MD: [facility]   Date of discharge: 06/10/2016  Admitting diagnosis: 40wk induction Intrauterine pregnancy: [redacted]w[redacted]d     Secondary diagnosis:  Active Problems:   Term pregnancy   SVD (spontaneous vaginal delivery)   Postpartum care following vaginal delivery  Additional problems: none     Discharge diagnosis: Term Pregnancy Delivered                                                                                                Post partum procedures:none  Augmentation: AROM and Pitocin  Complications: None  Hospital course:  Induction of Labor With Vaginal Delivery   31 y.o. yo G2P1011 at [redacted]w[redacted]d was admitted to the hospital 06/08/2016 for induction of labor.  Indication for induction: Favorable cervix at term.  Patient had an uncomplicated labor course as follows: Membrane Rupture Time/Date: 9:56 AM ,06/08/2016   Intrapartum Procedures: Episiotomy: None [1]                                         Lacerations:  1st degree [2];Periurethral [8]  Patient had delivery of a Viable infant.  Information for the patient's newborn:  Charlottie, Peragine [[id number]]  Delivery Method: Vag-Spont   06/08/2016  Details of delivery can be found in separate delivery note.  Patient had a routine postpartum course. Patient is discharged home 06/10/16.  Physical exam  Vitals:   06/09/16 0200 06/09/16 1030 06/09/16 1753 06/10/16 0548  BP: 118/69 (!) 113/57 110/67 114/76  Pulse: 90 94 75 83  Resp: 20 18 18 18   Temp:  98.6 F (37 C) 98.6 F (37 C) 98.4 F (36.9 C)  TempSrc:  Oral Oral Oral  SpO2:  98%    Weight:      Height:       General: alert and cooperative Lochia: appropriate Uterine Fundus: firm  Labs: Lab Results  Component Value Date   WBC 9.7 06/09/2016   HGB 10.3 (L) 06/09/2016   HCT 29.7 (L) 06/09/2016   MCV 79.8 06/09/2016   PLT 198 06/09/2016   CMP Latest Ref Rng & Units 10/06/2010  Glucose 70 - 99 mg/dL 782(N)  BUN 6 - 23 mg/dL 11  Creatinine 0.4 - 1.2 mg/dL 5.62  Sodium 130 - 865 mEq/L 136  Potassium 3.5 - 5.1 mEq/L 4.0  Chloride 96 - 112 mEq/L 101  CO2 19 - 32 mEq/L 24  Calcium 8.4 - 10.5 mg/dL 9.5  Total Protein - -  Total Bilirubin - -  Alkaline Phos - -  AST - -  ALT - -    Discharge instruction: per After Visit Summary and "Baby and Me Booklet".  After visit meds:  Allergies as of 06/10/2016   No Known Allergies     Medication List    STOP taking these medications   terconazole 0.8 % vaginal cream Commonly known as:  TERAZOL 3  TAKE these medications   ibuprofen 600 MG tablet Commonly known as:  ADVIL,MOTRIN Take 1 tablet (600 mg total) by mouth every 6 (six) hours.   oxyCODONE 5 MG immediate release tablet Commonly known as:  Oxy IR/ROXICODONE Take 1-2 tablets (5-10 mg total) by mouth every 4 (four) hours as needed for moderate pain.   prenatal multivitamin Tabs tablet Take 1 tablet by mouth daily at 12 noon.       Diet: routine diet  Activity: Advance as tolerated. Pelvic rest for 6 weeks.   Outpatient follow up:6 weeks Follow up Appt:No future appointments. Follow up Visit:No Follow-up on file.  Postpartum contraception: Undecided  Newborn Data: Live born female  Birth Weight: 5 lb 4.3 oz (2390 g) APGAR: 9, 9  Baby Feeding: Breast Disposition:home with mother   06/10/2016 Oliver PilaICHARDSON,Gracilyn Gunia W, MD

## 2016-06-10 NOTE — Plan of Care (Signed)
Problem: Coping: Goal: Ability to cope will improve Outcome: Completed/Met Date Met: 06/10/16 Mother is coping well with infant care, breastfeeding, pumping and supplementing.

## 2016-06-10 NOTE — Lactation Note (Signed)
This note was copied from a baby's chart. Lactation Consultation Note  Patient Name: Boy Shannan HarperDeBorah Hedges ZOXWR'UToday's Date: 06/10/2016 Reason for consult: Follow-up assessment (LC updated chart ) Baby is 6141 hours old. Baby completed feeding of breast fed 25 mins , and supplemented 5 ml prior to as an appetizer and 15 ml afterwards. LC reviewed with mom due to the abby being < 5 pounds that the supplementing guidelines would be switched to the LPT guidelines. And by the end of today to the baby should be up to 25 -30 ml post latching at the breast.  LC explained the reasons for the supplementing switch.  LC also recommended feeding the baby at the breast 20 mins, ( 30 mins max ) on the same breast , and supplementing afterwards following the guidelines/ PACE feeding - which the [facility] reviewed and showed mom. Mom receptive the [facility] plan. And grandmother very supportive and assisting mom.  LC instructed mom on the use shells due to areola being semi- compressible,and initially feeling some discomfort with latch.  LC discussed nutritive vs non - nutritive feeding patterns and to watch for hanging out.  LC recommended if it has been 3 hours and the baby isn't awake to check diaper , place baby STS , if still sluggish , try appetizer, and then latch, if still sluggish , finish the supplement and call it a feeding unless the baby then shows feeding cues. Post  pump both breast for 10 -15 mins , save milk for supplementing.  LC obtained the Lactation O/P order / referral form and it was completed.  LC O/P appt. Will need to be made on day of D/C ( mom receptive )  [facility] and faxed form for request , but due to mom being D/C on the weekend [facility] DEBP . PW given today with instructions and mom aware to fill it out before tomorrow am. Also mom aware it is $30.00 cash for loaner.    Maternal Data Does the patient have breastfeeding experience prior to this delivery?:  No  Feeding Feeding Type: Formula Nipple Type: Slow - flow Length of feed: 25 min (LC noted swallows , increased with breast compressions )  LATCH Score/Interventions Latch: Grasps breast easily, tongue down, lips flanged, rhythmical sucking. Intervention(s): Adjust position;Assist with latch;Breast massage;Breast compression  Audible Swallowing: Spontaneous and intermittent  Type of Nipple: Everted at rest and after stimulation  Comfort (Breast/Nipple): Filling, red/small blisters or bruises, mild/mod discomfort  Problem noted: Mild/Moderate discomfort  Hold (Positioning): Assistance needed to correctly position infant at breast and maintain latch. Intervention(s): Breastfeeding basics reviewed  LATCH Score: 8  Lactation Tools Discussed/Used Tools: [place] WIC Program:  [facility](WIC loaner PW given to mom to fill out for tomorrow )   Consult Status Consult Status: Follow-up Date: 06/11/16 Follow-up type: In-patient    Matilde SprangMargaret Ann Ardian Haberland 06/10/2016, 12:06 PM

## 2016-06-11 ENCOUNTER — Ambulatory Visit: Payer: Self-pay

## 2016-06-11 NOTE — Lactation Note (Signed)
This note was copied from a baby's chart. Lactation Consultation Note  Patient Name: Sonia Maxwell ZOXWR'UToday's Date: 06/11/2016 Reason for consult: Infant < 6lbs;Follow-up assessment   Full term baby 3163 hours old < 6 lbs.  Stools brownish/yellow/seedy. Mother's breast are filling and baby has not been feeding q 3 hours (closer to every 4 hours).  Discussed feeding schedule with mother and grandmother and the importance of emptying her breasts.  Her breasts are not engorged yet, hard firm areas which emptied when pumping.  Reviewed hand expression.  Drops expressed bilaterally. Baby had lots of stimulation during consult and did not effectively latch.  He had 2 diaper changes, doctor assessment and 2 breastfeeding attempts.  Suggest with all the stimulation to stop trying and give baby pumped breastmilk.  Mother recently had pumped 55 ml.  Provided written instructions and discussed.  Family in agreement.  Reviewed engorgement care and monitoring voids/stools.  Reviewed milk storage and [facility]. Set up OP 1/31 1pm.  1. Hand express before latching if you have time. 2. Breastfeed baby on both breasts for up to 25 min. 3. If baby is frustrated or too sleepy and does not latch after 10 min of trying, then supplement w/ pumped breastmilk. 4. After feeding or attempt give baby volume based on guidelines on green sheet.  Give the difference with formula if breastmilk not available. 5. Increase every day as baby desires.  Start increasing in increments of 5-10 ml. 6. During bottle feedings give baby break - paced feeding. 7. Post pump 4-6 times a day for 10 -20 min.   8. Give pumped breastmilk back at next feeding. 9. Massage breasts during feedings and and pumping to soften. 10. Baby should feed 8-12 times a day at least every 3 hours and also with feeding cues (more often if he desires). 11. If baby is not latching to breast at all, you will need to pump every 3 hours or 8 times a day  for 10-20 min    Maternal Data    Feeding Feeding Type: Breast Milk Nipple Type: Slow - flow  LATCH Score/Interventions                      Lactation Tools Discussed/Used Pump Review: Setup, frequency, and cleaning;Milk Storage   Consult Status Consult Status: Follow-up Follow-up type: Out-patient    Dahlia ByesBerkelhammer, Ruth [facility] 06/11/2016, 11:40 AM

## 2016-06-15 ENCOUNTER — Ambulatory Visit (HOSPITAL_COMMUNITY): Admission: RE | Admit: 2016-06-15 | Payer: Medicaid Other | Source: Ambulatory Visit

## 2016-06-16 ENCOUNTER — Ambulatory Visit (HOSPITAL_COMMUNITY)
Admission: RE | Admit: 2016-06-16 | Discharge: 2016-06-16 | Disposition: A | Discharge status: Home / Self Care | Payer: Medicaid Other | Source: Ambulatory Visit | Attending: Physician Assistant | Admitting: Physician Assistant

## 2016-06-16 NOTE — Lactation Note (Signed)
Lactation Consult / baby Sonia Maxwell. , Sonia Maxwell, and dad present for [facility] consult. Dad went back to the car for a nap.  Per Sonia when baby was in the hospital would only latch on the right, not the left.  At home has been attempting to latch, so Sonia has been pumping both breast with a DEBP [facility] loaner every 2-3 hours with 9 oz per pumping. Initially at home had engorgement issues for 1 day , and then sometimes in the am.  Voids - 9 , stools - mustard yellow. - 9 .  Sonia brought back [facility] loaner - and has obtained her DEBP from [facility].  Sonia has been very motivated to be consistent with her pumping and only has been attempting to latch. Last feeding was at 1300 -65 ml    Lactation Impression -  Baby over Birth weight by 6 oz  Sonia has protected Milk supply  Baby latched at consult easily ( both breast ) and transferred off 104 ml  Excellent for 8 day old .  Sonia receptive to teaching.  See LC plan below    Mother's reason for visit:  Better latching  Visit Type: feeding assessment  Appointment Notes:  >6 pounds, sleepy at the breast , will need feeding plan updated.  Mother pumping approx 55 ml. Rescheduled from 1/30 1 pm appt. , confirmed appt. 06/15/16  Consult:  Initial Lactation Consultant:  Matilde Sprang [place]  ________________________________________________________________________  Joan Flores Name:  Murray Hodgkins. Date of Birth:  06/08/2014 [facility]  Gender:  female Gestational Age: [redacted]w[redacted]d (At Birth) Birth Weight:  5 lb 4.3 oz (2390 g) Weight at Discharge:    Weight: (!) 5 lb 0.1 oz (2270 g)                         Date of Discharge:  06/11/2016      Filed Weights   06/08/16 1821 06/10/16 0000 06/10/16 2354  Weight: 5 lb 4.3 oz (2390 g) (!) 4 lb 14.8 oz (2235 g) (!) 5 lb 0.1 oz (2270 g)  [facility] taken [facility]: 5-9 oz 1/30    [facility] [place]  Weight today:  5-10.0 oz , 2552g     ________________________________________________________________________  Mother's Name: Shannan Maxwell Type of delivery:  Vaginal Delivery  Breastfeeding Experience:  1st baby  Maternal Medical Conditions:  No risk  Maternal Medications:  Motrin , PNV   ________________________________________________________________________  Breastfeeding History (Post Discharge) - see above note  _____________________________________________________________________  Maternal Breast Assessment  Breast:  Full Nipple:  Erect Pain level:  0 Pain interventions:  Expressed breast milk  _______________________________________________________________________ Feeding Assessment/Evaluation - baby alert and hungry   Initial feeding assessment:  Infant's oral assessment:  WNL  Positioning:  Football Right breast  LATCH documentation:  Latch:  2 = Grasps breast easily, tongue down, lips flanged, rhythmical sucking.  Audible swallowing:  2 = Spontaneous and intermittent  Type of nipple:  2 = Everted at rest and after stimulation  Comfort (Breast/Nipple):  1 = Filling, red/small blisters or bruises, mild/mod discomfort  Hold (Positioning):  1 = Assistance needed to correctly position infant at breast and maintain latch  LATCH score: 8   Attached assessment:  Deep  Lips flanged:  Yes.    Lips untucked:  Yes.    Suck assessment:  Nutritive  Tools:  None   Pre-feed weight: 2552 g ,  5-10.0 oz  Post-feed weight:  2638 g , 5-13.0 oz  Amount transferred:  86 ml  Amount supplemented:  None needed   Additional Feeding Assessment -    Positioning:  Football Left breast  LATCH documentation:  Latch:  2 = Grasps breast easily, tongue down, lips flanged, rhythmical sucking.  Audible swallowing:  2 = Spontaneous and intermittent  Type of nipple:  2 = Everted at rest and after stimulation  Comfort (Breast/Nipple):  1 = Filling, red/small blisters or bruises, mild/mod discomfort  Hold  (Positioning):  2 = No assistance needed to correctly position infant at breast  LATCH score: 9  Attached assessment:  Deep  Lips flanged:  Yes.    Lips untucked:  Yes.    Suck assessment:  Nutritive  Tools:  None  IPre-feed weight:2638 g , 5-13.0 oz  Post-feed weight: 2656 g , 5-13.7 oz  Amount transferred: 18 ml  Amount supplemented: none needed    Total amount pumped post feed: did not need to post pump  Total amount transferred: 104 ml  Total supplement given:  None   Lactation Plan for home: [facility] Goals - protect establishing milk supply  Feed Sonia - every 2-3 hours and with feeding cues  Growth Spurts - 7-10 days , 3 weeks , 6 weeks - cluster feeding normal  STS feedings until he can stay awake  Work on alternating feeding positions  Ex. Football / PsychiatristCross cradle , side lying/  Cradle position when Sonia develops stronger muscles in his neck  Option #1 - If over full , express off the 1st breast 20 -30 ml  Latch - feed . Soften breast ( breast compressions with latch and intermittently )  Offer 2nd breast . ( may not have to pump  Option #2 - Feed 1st breast , soften well , offer 2nd breast , if Sonia only feeds 1st breast release down 10 mins. Option 3 Sonia receives a bottle 60 - 90 ml , and pump both breast 15 -20 mins.  Extra pumping - if necessary , , after am feeding , mid day  10 -15 mins.

## 2016-09-09 IMAGING — US US OB COMP LESS 14 WK
1 series · 15 of 28 positions shown · non-contrast
Comparison: No prior scans from this gestation.

CLINICAL DATA: 30-year-old pregnant female with vaginal bleeding.
Quantitative beta HCG pending.

EDC by LMP: 06/12/2016, projecting to an expected gestational age of
4 weeks 3 days.
EXAM:
OBSTETRIC <14 WK US AND TRANSVAGINAL OB US
TECHNIQUE: Both transabdominal and transvaginal ultrasound examinations were
performed for complete evaluation of the gestation as well as the
maternal uterus, adnexal regions, and pelvic cul-de-sac.
Transvaginal technique was performed to assess early pregnancy.

[Series 1: us ob comp less 14 wk · 15 of 39 slices shown]
[im 1/39]
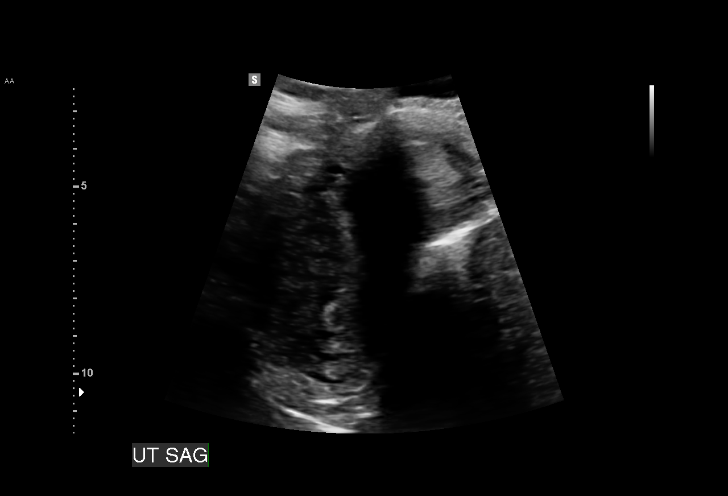
[im 3/39]
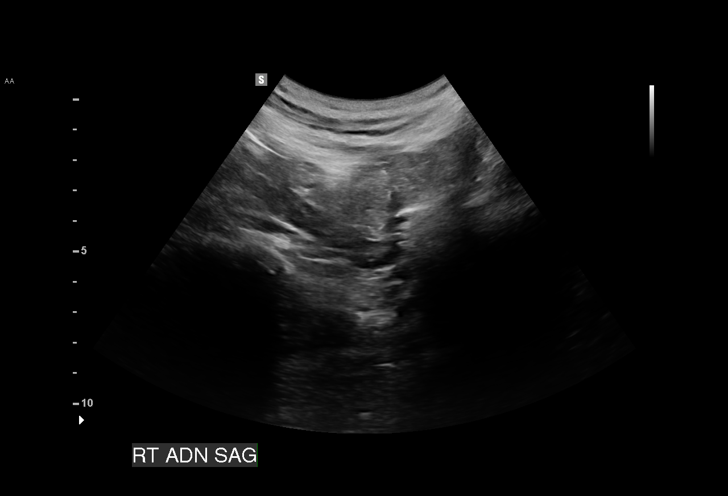
[im 6/39]
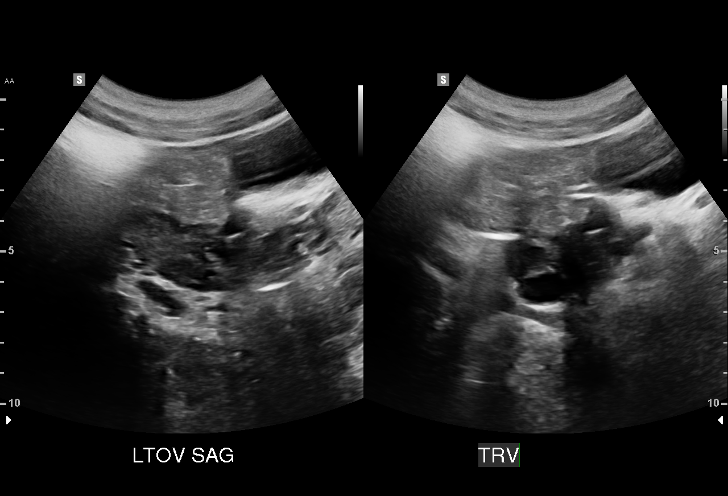
[im 9/39]
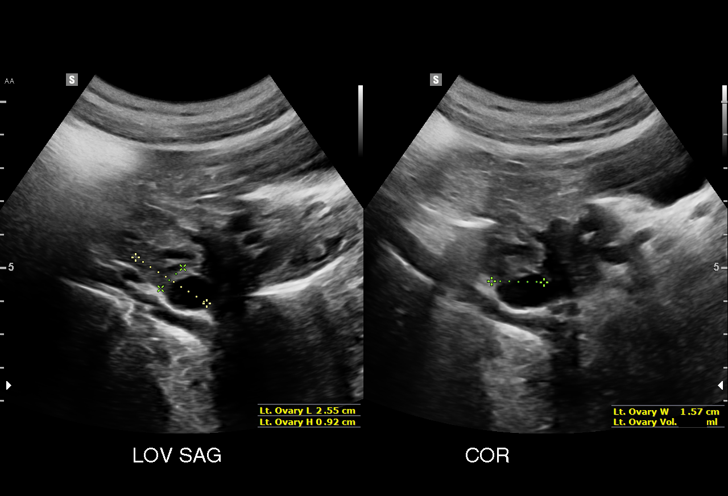
[im 12/39]
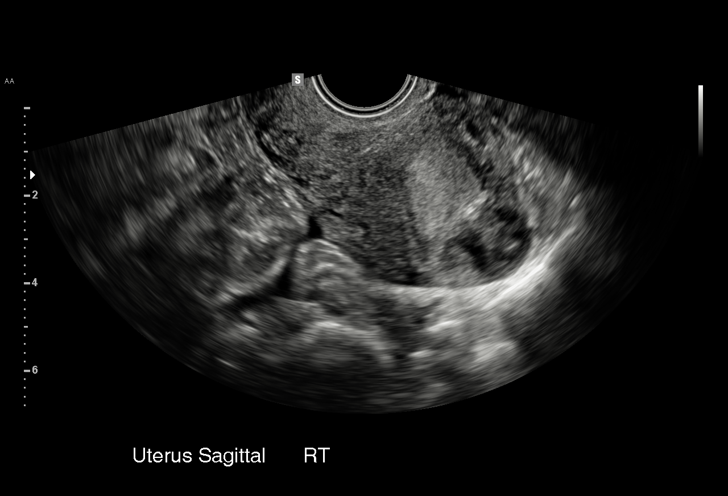
[im 15/39]
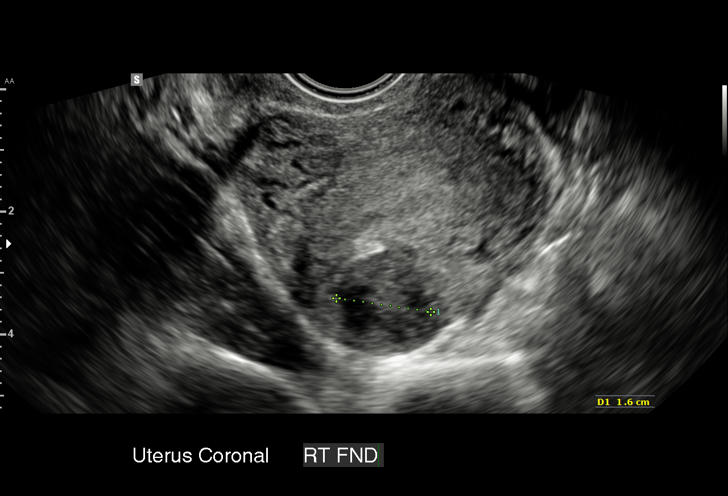
[im 17/39]
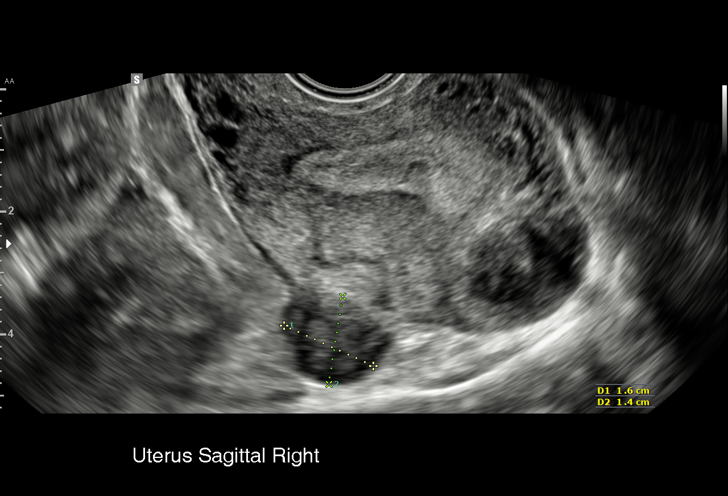
[im 20/39]
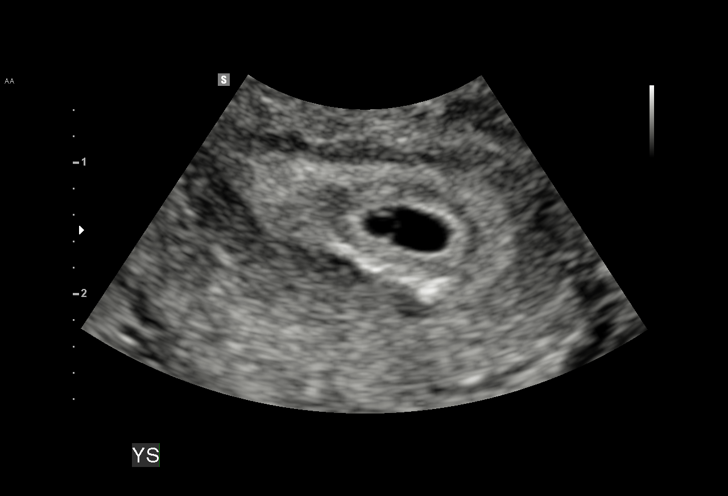
[im 22/39]
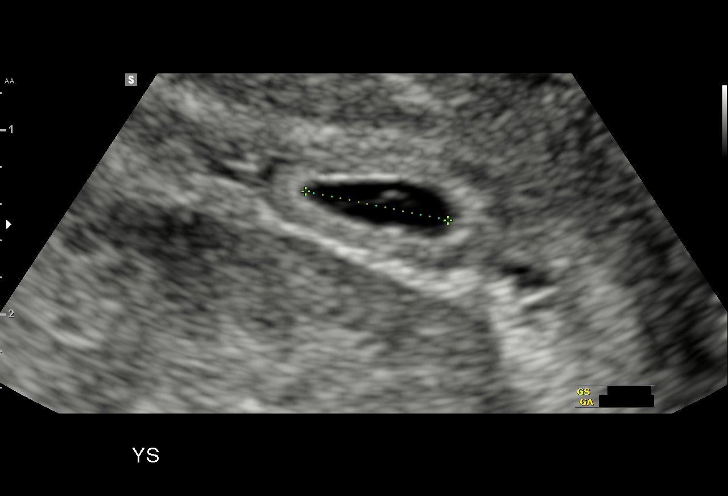
[im 24/39]
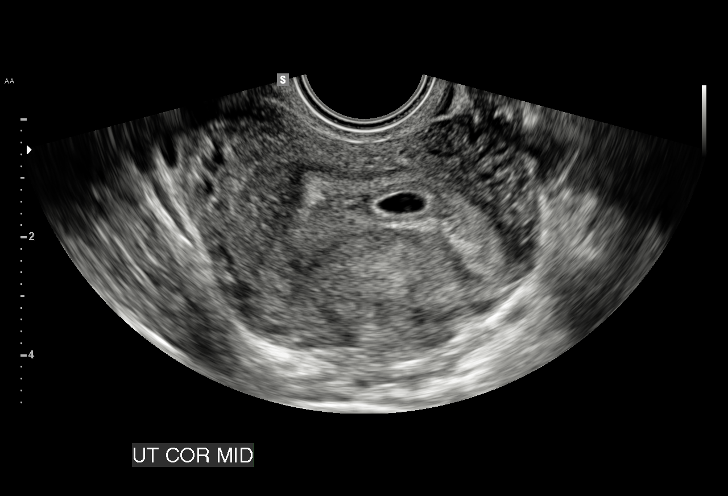
[im 27/39]
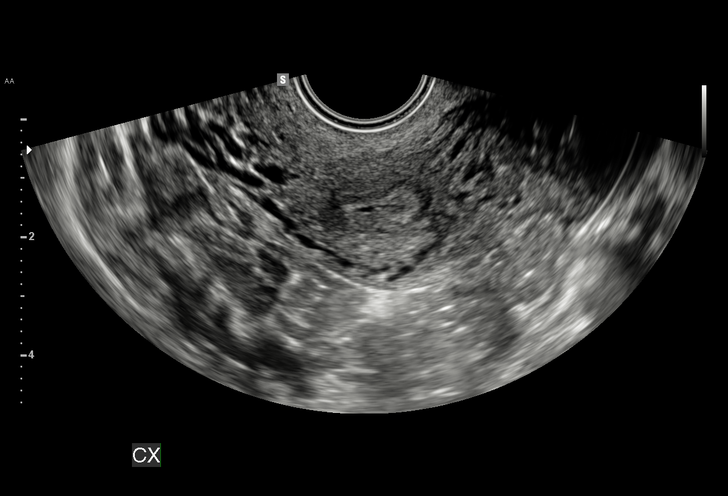
[im 30/39]
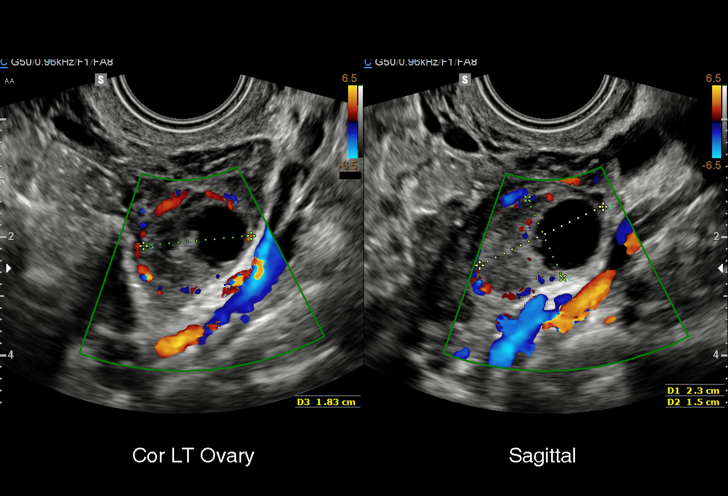
[im 33/39]
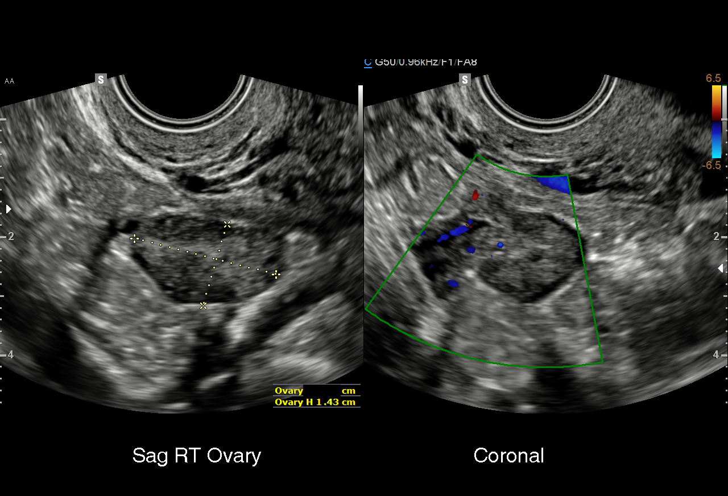
[im 36/39]
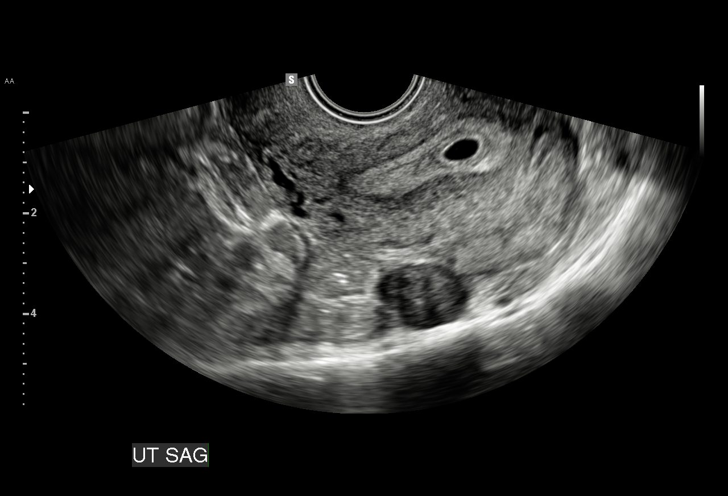
[im 39/39]
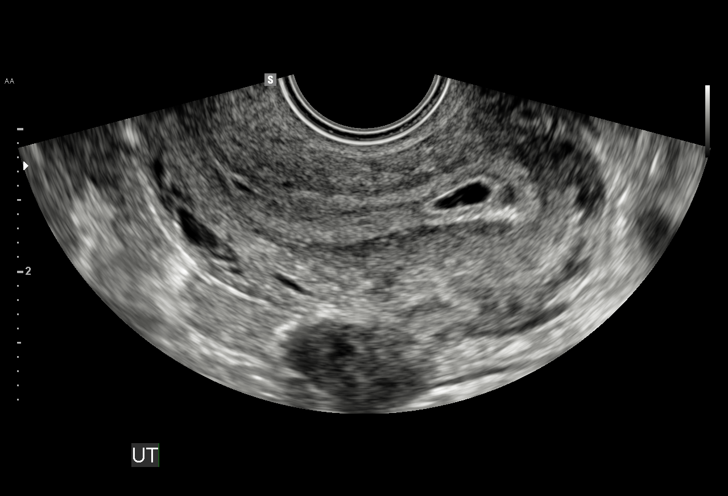

[15 of 28 positions shown; findings below may reference images not displayed]

FINDINGS: Intrauterine gestational sac: Single intrauterine gestational sac
appears normal in shape and position.

Yolk sac:  Present.

Embryo:  Not visualized.

Embryonic Cardiac Activity: Not visualized.

MSD: 5.9  mm   5 w   2  d        US EDC: 06/06/2016

Subchorionic hemorrhage:  None visualized.

Maternal uterus/adnexae: Retroverted retroflexed uterus.
Intramural/subserosal 1.3 x 1.5 x 1.6 cm anterior fundal fibroid.
Anterior uterine body subserosal 1.6 x 1.4 x 1.5 cm fibroid. Left
ovary measures 3.1 x 1.8 x 1.8 cm and contains a 1.8 cm corpus
luteum. Right ovary measures 2.5 x 1.4 x 1.7 cm. No suspicious
ovarian or adnexal masses. No abnormal free fluid in the pelvis.
IMPRESSION: 1. Single intrauterine gestational sac with yolk sac at 5 weeks 2
days by mean sac diameter. No embryo visualized, which could be due
to early gestational age. No perigestational bleed. A follow-up
obstetric scan is recommended in 11-14 days to reassess viability.
2. Small uterine fibroids as described.
3. No suspicious ovarian or adnexal masses.

## 2019-04-24 ENCOUNTER — Other Ambulatory Visit: Payer: Self-pay

## 2019-04-24 ENCOUNTER — Emergency Department (HOSPITAL_COMMUNITY): Payer: Medicaid Other

## 2019-04-24 ENCOUNTER — Encounter (HOSPITAL_COMMUNITY): Payer: Self-pay

## 2019-04-24 ENCOUNTER — Emergency Department (HOSPITAL_COMMUNITY)
Admission: EM | Admit: 2019-04-24 | Discharge: 2019-04-24 | Disposition: A | Discharge status: Home / Self Care | Payer: Medicaid Other | Attending: Emergency Medicine | Admitting: Emergency Medicine

## 2019-04-24 DIAGNOSIS — Z20822 Contact with and (suspected) exposure to covid-19: Secondary | ICD-10-CM

## 2019-04-24 DIAGNOSIS — R0989 Other specified symptoms and signs involving the circulatory and respiratory systems: Secondary | ICD-10-CM | POA: Diagnosis present

## 2019-04-24 DIAGNOSIS — Z79899 Other long term (current) drug therapy: Secondary | ICD-10-CM | POA: Insufficient documentation

## 2019-04-24 DIAGNOSIS — Z87891 Personal history of nicotine dependence: Secondary | ICD-10-CM | POA: Insufficient documentation

## 2019-04-24 DIAGNOSIS — J069 Acute upper respiratory infection, unspecified: Secondary | ICD-10-CM | POA: Insufficient documentation

## 2019-04-24 MED ORDER — ALBUTEROL SULFATE HFA 108 (90 BASE) MCG/ACT IN AERS
2.0000 | INHALATION_SPRAY | Freq: Once | RESPIRATORY_TRACT | Status: AC
  Administered 2019-04-24: 2 via RESPIRATORY_TRACT
  Filled 2019-04-24: qty 6.7

## 2019-04-24 MED ORDER — BENZONATATE 100 MG PO CAPS: 100.0000 mg | ORAL_CAPSULE | Freq: Three times a day (TID) | ORAL | 0 refills | Status: AC | PRN

## 2019-04-24 NOTE — Discharge Instructions (Signed)
Please take your medication, as prescribed.  Continue Mucinex and over-the-counter medications for symptomatic relief of your upper respiratory infection.  Please return to the ED or seek medical attention for any fevers or chills uncontrolled by Tylenol and ibuprofen, difficulty breathing, or any other new or worsening symptoms.   If you live with, or provide care at home for, a person confirmed to have, or being evaluated for, COVID-19 infection please follow these guidelines to prevent infection:  Follow healthcare providers instructions Make sure that you understand and can help the patient follow any healthcare provider instructions for all care.  Provide for the patients basic needs You should help the patient with basic needs in the home and provide support for getting groceries, prescriptions, and other personal needs.  Monitor the patients symptoms If they are getting sicker, call his or her medical provider a  This will help the healthcare providers office take steps to keep other people from getting infected. Ask the healthcare provider to call the local or state health department.  Limit the number of people who have contact with the patient If possible, have only one caregiver for the patient. Other household members should stay in another home or place of residence. If this is not possible, they should stay in another room, or be separated from the patient as much as possible. Use a separate bathroom, if available. Restrict visitors who do not have an essential need to be in the home.  Keep older adults, very young children, and other sick people away from the patient Keep older adults, very young children, and those who have compromised immune systems or chronic health conditions away from the patient. This includes people with chronic heart, lung, or kidney conditions, diabetes, and cancer.  Ensure good ventilation Make sure that shared spaces in the home have good  air flow, such as from an air conditioner or an opened window, weather permitting.  Wash your hands often Wash your hands often and thoroughly with soap and water for at least 20 seconds. You can use an alcohol based hand sanitizer if soap and water are not available and if your hands are not visibly dirty. Avoid touching your eyes, nose, and mouth with unwashed hands. Use disposable paper towels to dry your hands. If not available, use dedicated cloth towels and replace them when they become wet.  Wear a facemask and gloves Wear a disposable facemask at all times in the room and gloves when you touch or have contact with the patients blood, body fluids, and/or secretions or excretions, such as sweat, saliva, sputum, nasal mucus, vomit, urine, or feces.  Ensure the mask fits over your nose and mouth tightly, and do not touch it during use. Throw out disposable facemasks and gloves after using them. Do not reuse. Wash your hands immediately after removing your facemask and gloves. If your personal clothing becomes contaminated, carefully remove clothing and launder. Wash your hands after handling contaminated clothing. Place all used disposable facemasks, gloves, and other waste in a lined container before disposing them with other household waste. Remove gloves and wash your hands immediately after handling these items.  Do not share dishes, glasses, or other household items with the patient Avoid sharing household items. You should not share dishes, drinking glasses, cups, eating utensils, towels, bedding, or other items After the person uses these items, you should wash them thoroughly with soap and water.  Wash laundry thoroughly Immediately remove and wash clothes or bedding that have blood, body  fluids, and/or secretions or excretions, such as sweat, saliva, sputum, nasal mucus, vomit, urine, or feces, on them. Wear gloves when handling laundry from the patient. Read and follow  directions on labels of laundry or clothing items and detergent. In general, wash and dry with the warmest temperatures recommended on the label.  Clean all areas the individual has used often Clean all touchable surfaces, such as counters, tabletops, doorknobs, bathroom fixtures, toilets, phones, keyboards, tablets, and bedside tables, every day. Also, clean any surfaces that may have blood, body fluids, and/or secretions or excretions on them. Wear gloves when cleaning surfaces the patient has come in contact with. Use a diluted bleach solution (e.g., dilute bleach with 1 part bleach and 10 parts water) or a household disinfectant with a label that says EPA-registered for coronaviruses. To make a bleach solution at home, add 1 tablespoon of bleach to 1 quart (4 cups) of water. For a larger supply, add  cup of bleach to 1 gallon (16 cups) of water. Read labels of cleaning products and follow recommendations provided on product labels. Labels contain instructions for safe and effective use of the cleaning product including precautions you should take when applying the product, such as wearing gloves or eye protection and making sure you have good ventilation during use of the product. Remove gloves and wash hands immediately after cleaning.  Monitor yourself for signs and symptoms of illness Caregivers and household members are considered close contacts, should monitor their health, and will be asked to limit movement outside of the home to the extent possible. Follow the monitoring steps for close contacts listed on the symptom monitoring form.   ? If you have additional questions, contact your local health department or call the epidemiologist on call at [phone] (available 24/7). ? This guidance is subject to change. For the most up-to-date guidance from [facility], please refer to their website: [email or web address][email or web address]

## 2019-04-24 NOTE — ED Notes (Signed)
Garrett PA at bedside 

## 2019-04-24 NOTE — ED Triage Notes (Signed)
Patient reports being tested for covid today at [facility] hospital and no results are back yet.   C/o chest congestion and fever Denies chest pain  Patient reports taking mucinex around 2:30 pm with no relief .    Ed triage temp 99.9

## 2019-04-24 NOTE — ED Provider Notes (Addendum)
[facility]-EMERGENCY DEPT Provider Note   CSN: [id number] Arrival date & time: 04/24/19  1744     History   Chief Complaint Chief Complaint  Patient presents with  . chest congestion    waiting for covid test   . Fever    HPI Sonia Maxwell is a 33 y.o. female with no significant past medical history presents to the ED with complaints of chest congestion and fever.  Patient reports a 10-day history of chest congestion and cough.  Yesterday she developed headache, fevers, and chills.  Today her headache is resolved, but her fevers and shortness of breath symptoms have continued.  She obtain COVID-19 testing at [facility] today, results pending.  She denies any dizziness, chest pain, abdominal pain, nausea or vomiting, change in bowel habits, urinary symptoms, or other symptoms at this time.  She has been taking Mucinex, with some effect.  She reports cough productive of a clear, yellowish phlegm.  She denies any obvious sick contacts, she works at a [employer].     HPI  Past Medical History:  Diagnosis Date  . Infection    UTI  . Trichimoniasis   . Trichomonas contact, treated     Patient Active Problem List   Diagnosis Date Noted  . Term pregnancy 06/08/2016  . SVD (spontaneous vaginal delivery) 06/08/2016  . Postpartum care following vaginal delivery 06/08/2016  . Spotting 10/25/2014  . BV (bacterial vaginosis) 10/25/2014    Past Surgical History:  Procedure Laterality Date  . NO PAST SURGERIES       OB History    Gravida  2   Para  1   Term  1   Preterm      AB  1   Living  1     SAB  1   TAB      Ectopic      Multiple  0   Live Births  1            Home Medications    Prior to Admission medications   Medication Sig Start Date End Date Taking? Authorizing Provider  benzonatate (TESSALON) 100 MG capsule Take 1 capsule (100 mg total) by mouth every 8 (eight) hours as needed for cough. 04/24/19    Lorelee New, PA-C  ibuprofen (ADVIL,MOTRIN) 600 MG tablet Take 1 tablet (600 mg total) by mouth every 6 (six) hours. 06/10/16   Huel Cote, MD  oxyCODONE (OXY IR/ROXICODONE) 5 MG immediate release tablet Take 1-2 tablets (5-10 mg total) by mouth every 4 (four) hours as needed for moderate pain. 06/10/16   Huel Cote, MD  Prenatal Vit-Fe Fumarate-FA (PRENATAL MULTIVITAMIN) TABS tablet Take 1 tablet by mouth daily at 12 noon.    [provider]    Family History Family History  Problem Relation Age of Onset  . Diabetes Mother   . Asthma Sister     Social History Social History   Tobacco Use  . Smoking status: Former Smoker    Packs/day: 0.50    Years: 4.00    Pack years: 2.00    Types: Cigarettes, E-cigarettes  . Smokeless tobacco: Never Used  . Tobacco comment: vape  Substance Use Topics  . Alcohol use: No  . Drug use: No     Allergies   Patient has no known allergies.   Review of Systems Review of Systems  All other systems reviewed and are negative.    Physical Exam Updated Vital Signs BP (!) 142/115 (  BP Location: Left Arm)   Pulse 88   Temp 99.9 F (37.7 C) (Oral)   Resp 20   SpO2 98%   Physical Exam Vitals signs and nursing note reviewed. Exam conducted with a chaperone present.  Constitutional:      Appearance: Normal appearance.  HENT:     Head: Normocephalic and atraumatic.  Eyes:     General: No scleral icterus.    Conjunctiva/sclera: Conjunctivae normal.  Neck:     Musculoskeletal: Normal range of motion and neck supple. No neck rigidity.  Cardiovascular:     Rate and Rhythm: Normal rate and regular rhythm.  Pulmonary:     Effort: Pulmonary effort is normal. No respiratory distress.     Breath sounds: Normal breath sounds. No wheezing or rales.  Abdominal:     General: Abdomen is flat. There is no distension.     Palpations: Abdomen is soft.     Tenderness: There is no abdominal tenderness. There is no guarding.   Skin:    General: Skin is dry.     Capillary Refill: Capillary refill takes less than 2 seconds.  Neurological:     Mental Status: She is alert and oriented to person, place, and time.     GCS: GCS eye subscore is 4. GCS verbal subscore is 5. GCS motor subscore is 6.  Psychiatric:        Mood and Affect: Mood normal.        Behavior: Behavior normal.        Thought Content: Thought content normal.      ED Treatments / Results  Labs (all labs ordered are listed, but only abnormal results are displayed) Labs Reviewed - No data to display  EKG None  Radiology Dg Chest 2 View  Result Date: 04/24/2019 CLINICAL DATA:  Possible COVID. Additional history provided: Chest congestion and fever. EXAM: CHEST - 2 VIEW COMPARISON:  No pertinent prior studies available for comparison. FINDINGS: Heart size within normal limits. There is no airspace consolidation within the lungs. No evidence of pleural effusion or pneumothorax. No acute bony abnormality. IMPRESSION: No evidence of acute cardiopulmonary abnormality. Electronically Signed   By: Kellie Simmering DO   On: 04/24/2019 18:20    Procedures Procedures (including critical care time)  Medications Ordered in ED Medications  albuterol (VENTOLIN HFA) 108 (90 Base) MCG/ACT inhaler 2 puff (2 puffs Inhalation Given 04/24/19 1955)     Initial Impression / Assessment and Plan / ED Course  I have reviewed the triage vital signs and the nursing notes.  Pertinent labs & imaging results that were available during my care of the patient were reviewed by me and considered in my medical decision making (see chart for details).        CT chest obtained and demonstrates no acute cardiopulmonary abnormalities.  No obvious consolidation concerning for pneumonia.  EKG demonstrates normal sinus rhythm.  Patient reports that she has a baby at home and she is scared that she may be COVID-19 positive.  I instructed her to isolate and have her mother take  care of her baby while her results are pending.  While patient endorses family history of asthma, she has no personal history.  She did however report wheezing well none was appreciated on auscultation.  Patient was given albuterol x2 in ED with some improvement.  She may continue that at home, as needed.  Also encouraged her to continue taking her Mucinex for symptomatic relief of her cough.  We  will also prescribe her Jerilynn Somessalon Perles given her reported difficulty sleeping due to her cough.   At time of discharge, patient is feeling improved.  She is reassured by the work-up today.  Discussed isolation instructions and strict return precautions.  All of the evaluation and work-up results were discussed with the patient and any family at bedside. They were provided opportunity to ask any additional questions and have none at this time. They have expressed understanding of verbal discharge instructions as well as return precautions and are agreeable to the plan.   Sonia Maxwell was evaluated in Emergency Department on 04/24/2019 for the symptoms described in the history of present illness. She was evaluated in the context of the global COVID-19 pandemic, which necessitated consideration that the patient might be at risk for infection with the SARS-CoV-2 virus that causes COVID-19. Institutional protocols and algorithms that pertain to the evaluation of patients at risk for COVID-19 are in a state of rapid change based on information released by regulatory bodies including the CDC and federal and state organizations. These policies and algorithms were followed during the patient's care in the ED.   Final Clinical Impressions(s) / ED Diagnoses   Final diagnoses:  Viral upper respiratory tract infection    ED Discharge Orders         Ordered    benzonatate (TESSALON) 100 MG capsule  Every 8 hours PRN     04/24/19 2029           Lorelee NewGreen, Aalayah Riles L, PA-C 04/24/19 2028    Lorelee NewGreen, Janeane Cozart L, PA-C  04/24/19 2030    Bethann BerkshireZammit, Joseph, MD 04/26/19 [id number]

## 2019-04-24 NOTE — ED Notes (Signed)
Pt verbalizes understanding of DC instructions. Pt belongings returned and is ambulatory out of ED.  

## 2019-04-25 LAB — NOVEL CORONAVIRUS, NAA: SARS-CoV-2, NAA: NOT DETECTED

## 2020-03-27 IMAGING — CR DG CHEST 2V
2 series · 2 of 2 positions shown · non-contrast
Comparison: No pertinent prior studies available for comparison.

CLINICAL DATA: Possible COVID. Additional history provided: Chest
congestion and fever.

EXAM:
CHEST - 2 VIEW

[w chest pa]
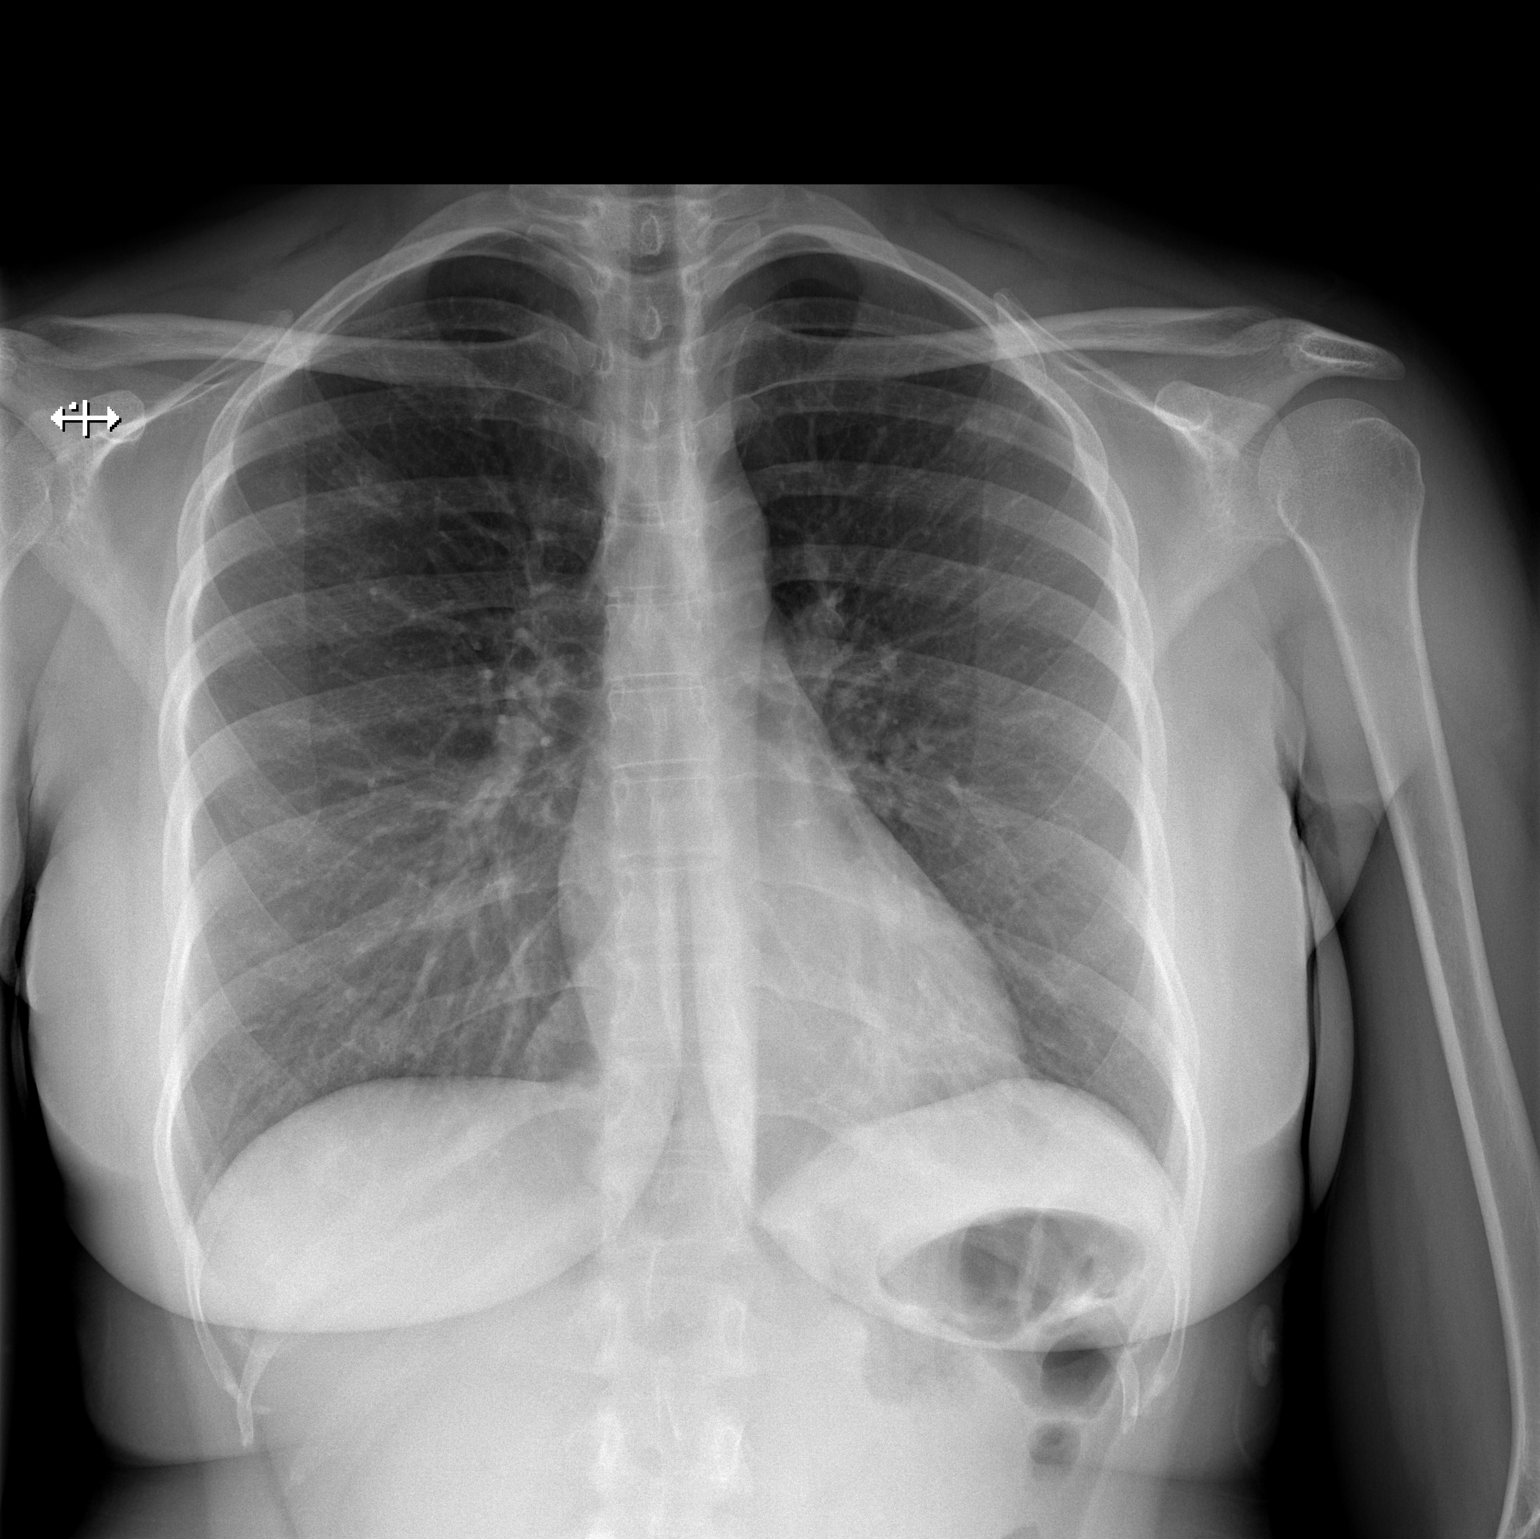

[w chest lat]
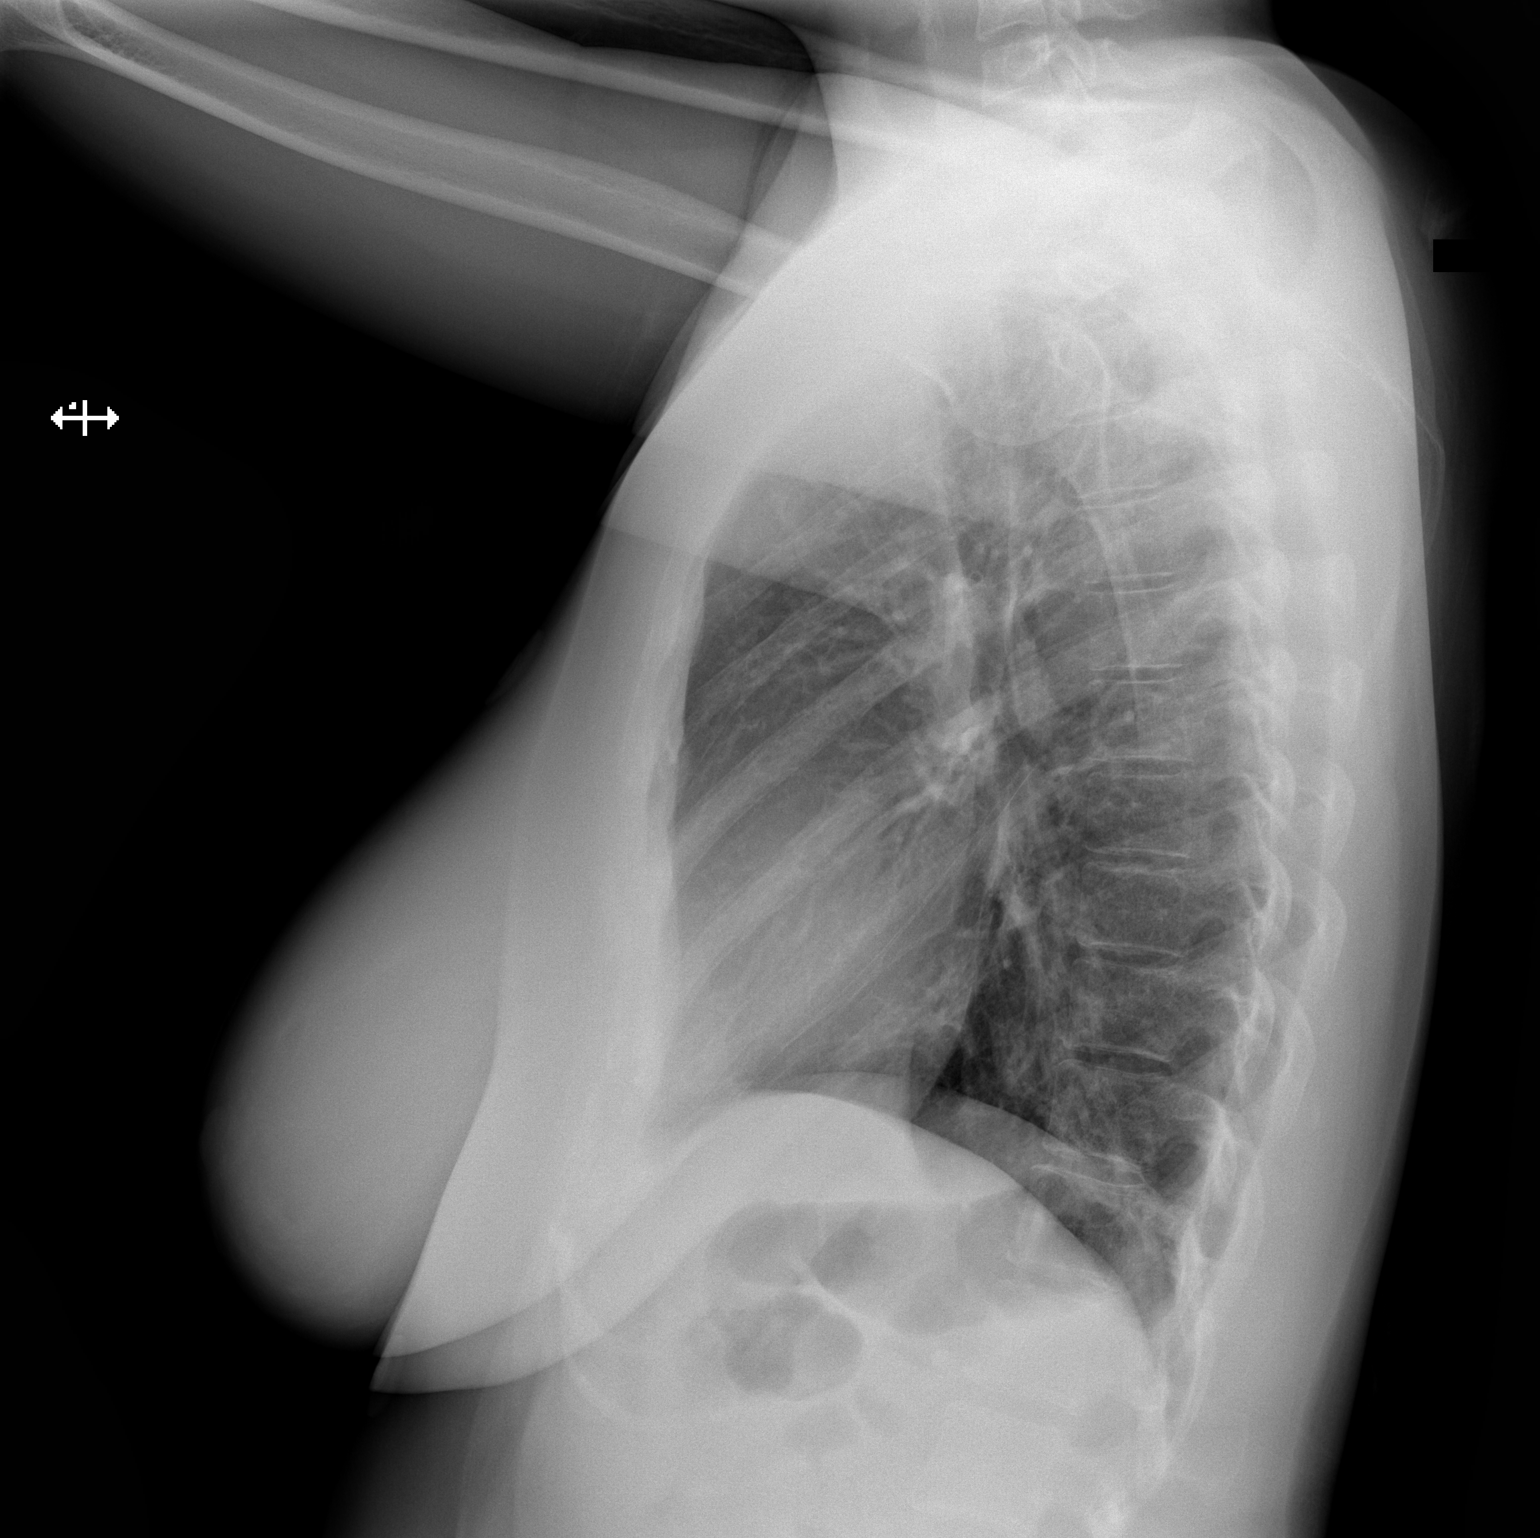

[2 of 2 positions shown; findings below may reference images not displayed]

FINDINGS: Heart size within normal limits.

There is no airspace consolidation within the lungs.

No evidence of pleural effusion or pneumothorax.

No acute bony abnormality.
IMPRESSION: No evidence of acute cardiopulmonary abnormality.

## 2023-10-13 ENCOUNTER — Ambulatory Visit
Admission: RE | Admit: 2023-10-13 | Discharge: 2023-10-13 | Disposition: A | Discharge status: Home / Self Care | Source: Ambulatory Visit | Attending: Family Medicine | Admitting: Family Medicine

## 2023-10-13 ENCOUNTER — Other Ambulatory Visit: Payer: Self-pay | Admitting: Family Medicine

## 2023-10-13 DIAGNOSIS — M546 Pain in thoracic spine: Secondary | ICD-10-CM

## 2023-10-13 DIAGNOSIS — M545 Low back pain, unspecified: Secondary | ICD-10-CM

## 2023-10-30 ENCOUNTER — Emergency Department (HOSPITAL_COMMUNITY)
Admission: EM | Admit: 2023-10-30 | Discharge: 2023-10-30 | Disposition: A | Discharge status: Home / Self Care | Attending: Emergency Medicine | Admitting: Emergency Medicine

## 2023-10-30 ENCOUNTER — Encounter (HOSPITAL_COMMUNITY): Payer: Self-pay

## 2023-10-30 ENCOUNTER — Other Ambulatory Visit: Payer: Self-pay

## 2023-10-30 ENCOUNTER — Emergency Department (HOSPITAL_COMMUNITY)

## 2023-10-30 DIAGNOSIS — M549 Dorsalgia, unspecified: Secondary | ICD-10-CM | POA: Diagnosis present

## 2023-10-30 DIAGNOSIS — R109 Unspecified abdominal pain: Secondary | ICD-10-CM | POA: Insufficient documentation

## 2023-10-30 LAB — COMPREHENSIVE METABOLIC PANEL WITH GFR
ALT: 20 U/L (ref 0–44)
AST: 23 U/L (ref 15–41)
Albumin: 4.4 g/dL (ref 3.5–5.0)
Alkaline Phosphatase: 90 U/L (ref 38–126)
Anion gap: 13 (ref 5–15)
BUN: 10 mg/dL (ref 6–20)
CO2: 22 mmol/L (ref 22–32)
Calcium: 9.5 mg/dL (ref 8.9–10.3)
Chloride: 103 mmol/L (ref 98–111)
Creatinine, Ser: 0.85 mg/dL (ref 0.44–1.00)
GFR, Estimated: >60 mL/min (ref >60)
Glucose, Bld: 133 mg/dL — ABNORMAL HIGH (ref 70–99)
Potassium: 3.5 mmol/L (ref 3.5–5.1)
Sodium: 138 mmol/L (ref 135–145)
Total Bilirubin: 0.8 mg/dL (ref 0.0–1.2)
Total Protein: 7.5 g/dL (ref 6.5–8.1)

## 2023-10-30 LAB — CBC WITH DIFFERENTIAL/PLATELET
Abs Immature Granulocytes: 0.02 10*3/uL (ref 0.00–0.07)
Basophils Absolute: 0 10*3/uL (ref 0.0–0.1)
Basophils Relative: 0 %
Eosinophils Absolute: 0.2 10*3/uL (ref 0.0–0.5)
Eosinophils Relative: 2 %
HCT: 45.9 % (ref 36.0–46.0)
Hemoglobin: 14.9 g/dL (ref 12.0–15.0)
Immature Granulocytes: 0 %
Lymphocytes Relative: 41 %
Lymphs Abs: 2.8 10*3/uL (ref 0.7–4.0)
MCH: 28.6 pg (ref 26.0–34.0)
MCHC: 32.5 g/dL (ref 30.0–36.0)
MCV: 88.1 fL (ref 80.0–100.0)
Monocytes Absolute: 0.5 10*3/uL (ref 0.1–1.0)
Monocytes Relative: 7 %
Neutro Abs: 3.4 10*3/uL (ref 1.7–7.7)
Neutrophils Relative %: 50 %
Platelets: 338 10*3/uL (ref 150–400)
RBC: 5.21 MIL/uL — ABNORMAL HIGH (ref 3.87–5.11)
RDW: 14.1 % (ref 11.5–15.5)
WBC: 6.8 10*3/uL (ref 4.0–10.5)
nRBC: 0 % (ref 0.0–0.2)

## 2023-10-30 LAB — HCG, QUANTITATIVE, PREGNANCY: hCG, Beta Chain, Quant, S: <1 m[IU]/mL (ref <5)

## 2023-10-30 MED ORDER — KETOROLAC TROMETHAMINE 30 MG/ML IJ SOLN
30.0000 mg | Freq: Once | INTRAMUSCULAR | Status: AC
  Administered 2023-10-30: 30 mg via INTRAVENOUS
  Filled 2023-10-30: qty 1

## 2023-10-30 MED ORDER — ONDANSETRON HCL 4 MG/2ML IJ SOLN
4.0000 mg | Freq: Once | INTRAMUSCULAR | Status: AC
  Administered 2023-10-30: 4 mg via INTRAVENOUS
  Filled 2023-10-30: qty 2

## 2023-10-30 MED ORDER — MORPHINE SULFATE (PF) 4 MG/ML IV SOLN
4.0000 mg | Freq: Once | INTRAVENOUS | Status: AC
  Filled 2023-10-30: qty 1

## 2023-10-30 NOTE — ED Triage Notes (Signed)
 BIBA from home for severe back pain.  Pain started months ago, got bad tonight.  Pain wraps around flank, feels cramping and stabbing   Home meds today Tramadol, cyclobenzaprine,methocarbamol, mobic  150 mcg fentanyl  by EMS

## 2023-10-30 NOTE — ED Provider Notes (Signed)
 [employer] EMERGENCY DEPARTMENT AT [facility] Provider Note   CSN: [id number] Arrival date & time: 10/30/23  4098     Patient presents with: Back Pain   Sonia Maxwell is a 38 y.o. female.   The history is provided by the patient and medical records.  Back Pain  38 y.o. F presenting to the ED with back/flank pain.  Ongoing for a few months now, has been following with PCP for same.  States she has been prescribed various medications without relief.  Recently sent for x-rays, told it was scoliosis and referred to orthopedics but never followed up.  Pain worsened again last night.  States feels it all throughout her back, sometimes cramping, sometimes stabbing.  Denies numbness/weakness.  No incontinence.  No prior surgeries.  No fever/chills.  Has been eating/drinking fine.  Had tramadol and flexeril today.  Prior to Admission medications   Medication Sig Start Date End Date Taking? Authorizing Provider  ketoconazole (NIZORAL) 2 % shampoo Apply 1 Application topically as directed. 10/26/23  Yes [provider]  meloxicam (MOBIC) 15 MG tablet Take 15 mg by mouth daily. 10/20/23  Yes [provider]  methocarbamol (ROBAXIN) 750 MG tablet Take 750 mg by mouth 2 (two) times daily. 09/25/23  Yes [provider]  traMADol (ULTRAM) 50 MG tablet Take 50 mg by mouth every 6 (six) hours as needed. 10/26/23  Yes [provider]  benzonatate  (TESSALON ) 100 MG capsule Take 1 capsule (100 mg total) by mouth every 8 (eight) hours as needed for cough. 04/24/19   Jhonnie Mosher, PA-C  cyclobenzaprine (FLEXERIL) 10 MG tablet Take 10 mg by mouth 3 (three) times daily as needed for muscle spasms.    [provider]  ibuprofen  (ADVIL ,MOTRIN ) 600 MG tablet Take 1 tablet (600 mg total) by mouth every 6 (six) hours. 06/10/16   Rogene Claude, MD  oxyCODONE  (OXY IR/ROXICODONE ) 5 MG immediate release tablet Take 1-2 tablets (5-10 mg total) by mouth every 4  (four) hours as needed for moderate pain. 06/10/16   Rogene Claude, MD  Prenatal Vit-Fe Fumarate-FA (PRENATAL MULTIVITAMIN) TABS tablet Take 1 tablet by mouth daily at 12 noon.    [provider]    Allergies: Patient has no known allergies.    Review of Systems  Musculoskeletal:  Positive for back pain.  All other systems reviewed and are negative.   Updated Vital Signs BP (!) 157/114 (BP Location: Right Arm)   Pulse 92   Temp 97.7 F (36.5 C) (Oral)   Resp (!) 23   Ht 5' 3 (1.6 m)   Wt 77.1 kg   SpO2 98%   BMI 30.11 kg/m   Physical Exam Vitals and nursing note reviewed.  Constitutional:      Appearance: She is well-developed.  HENT:     Head: Normocephalic and atraumatic.   Eyes:     Conjunctiva/sclera: Conjunctivae normal.     Pupils: Pupils are equal, round, and reactive to light.    Cardiovascular:     Rate and Rhythm: Normal rate and regular rhythm.     Heart sounds: Normal heart sounds.  Pulmonary:     Effort: Pulmonary effort is normal.     Breath sounds: Normal breath sounds.  Abdominal:     General: Bowel sounds are normal.     Palpations: Abdomen is soft.     Tenderness: There is no abdominal tenderness.     Comments: Soft, non-tender   Musculoskeletal:  General: Normal range of motion.     Cervical back: Normal range of motion.     Comments: No deformities noted the back, no bruising or signs of trauma, easily maneuvering and rolling around on stretcher without difficulty, moving all extremities equally, no focal deficit   Skin:    General: Skin is warm and dry.   Neurological:     Mental Status: She is alert and oriented to person, place, and time.     (all labs ordered are listed, but only abnormal results are displayed) Labs Reviewed  CBC WITH DIFFERENTIAL/PLATELET - Abnormal; Notable for the following components:      Result Value   RBC 5.21 (*)    All other components within normal limits  COMPREHENSIVE METABOLIC  PANEL WITH GFR - Abnormal; Notable for the following components:   Glucose, Bld 133 (*)    All other components within normal limits  HCG, QUANTITATIVE, PREGNANCY  URINALYSIS, W/ REFLEX TO CULTURE (INFECTION SUSPECTED)    EKG: None  Radiology: CT Renal Stone Study Result Date: 10/30/2023 CLINICAL DATA:  Abdominal and flank pain.  Cramping. EXAM: CT ABDOMEN AND PELVIS WITHOUT CONTRAST TECHNIQUE: Multidetector CT imaging of the abdomen and pelvis was performed following the standard protocol without IV contrast. RADIATION DOSE REDUCTION: This exam was performed according to the departmental dose-optimization program which includes automated exposure control, adjustment of the mA and/or kV according to patient size and/or use of iterative reconstruction technique. COMPARISON:  04/12/2008 FINDINGS: Lower chest: No acute findings. Hepatobiliary: No suspicious focal abnormality in the liver on this study without intravenous contrast. There is no evidence for gallstones, gallbladder wall thickening, or pericholecystic fluid. No intrahepatic or extrahepatic biliary dilation. Pancreas: No focal mass lesion. No dilatation of the main duct. No intraparenchymal cyst. No peripancreatic edema. Spleen: No splenomegaly. No suspicious focal mass lesion. Adrenals/Urinary Tract: Nodular thickening of the left adrenal gland evident. Right adrenal gland unremarkable. Kidneys unremarkable. No evidence for hydroureter. The urinary bladder appears normal for the degree of distention. Stomach/Bowel: Stomach is unremarkable. No gastric wall thickening. No evidence of outlet obstruction. Duodenum is normally positioned as is the ligament of Treitz. No small bowel wall thickening. No small bowel dilatation. The terminal ileum is normal. The appendix is normal. No gross colonic mass. No colonic wall thickening. Vascular/Lymphatic: No abdominal aortic aneurysm. No abdominal aortic atherosclerotic calcification. There is no  gastrohepatic or hepatoduodenal ligament lymphadenopathy. No retroperitoneal or mesenteric lymphadenopathy. No pelvic sidewall lymphadenopathy. Reproductive: IUD is visualized in the uterus. There is no adnexal mass. Other: No intraperitoneal free fluid. Musculoskeletal: No worrisome lytic or sclerotic osseous abnormality. IMPRESSION: No acute findings in the abdomen or pelvis. Specifically, no findings to explain the patient's history of abdominal pain. Electronically Signed   By: Donnal Fusi M.D.   On: 10/30/2023 05:32     Procedures   Medications Ordered in the ED  ketorolac (TORADOL) 30 MG/ML injection 30 mg (has no administration in time range)  morphine (PF) 4 MG/ML injection 4 mg (4 mg Intravenous Given 10/30/23 0314)  ondansetron  (ZOFRAN ) injection 4 mg (4 mg Intravenous Given 10/30/23 0432)                                    Medical Decision Making Amount and/or Complexity of Data Reviewed Labs: ordered. Radiology: ordered and independent interpretation performed. ECG/medicine tests: ordered and independent interpretation performed.  Risk Prescription drug management.  38 y.o. F here with back pain.  Ongoing x1 month.  Reportedly has been seeing PCP, told scoliosis on x-ray but never followed up with orthopedic.  Has tried tramadol, muscle relaxers, and anti-inflammatories without relief.  Patient is easily maneuvering around on stretcher during exam.  She is moving all of her extremities well.  She is not have any focal neurologic deficits.  Her abdomen is soft and nontender.  Denies any prior history of kidney stones.  No prior surgeries.  Will obtain labs, CT stone study.  Labs as above--no leukocytosis or electrolyte derangement.  CT stone study without any acute findings.  Patient is sleeping comfortably on repeat exam.  She has already tolerated p.o.  I have awoken her and discussed results with her and mother at bedside.  They voiced understanding.  I suspect this is  likely muscular in origin and she will need to follow-up with her PCP.  Can return here for new concerns.  Final diagnoses:  Acute back pain, unspecified back location, unspecified back pain laterality    ED Discharge Orders     None          Coretha Dew, PA-C 10/30/23 0610    Alissa April, MD 10/30/23 [phone]

## 2023-10-30 NOTE — Discharge Instructions (Addendum)
 Labs and CT today were normal. Can continue your medication from PCP.  You will need to follow-up with her. Return here for new concerns.

## 2024-01-14 DIAGNOSIS — K8012 Calculus of gallbladder with acute and chronic cholecystitis without obstruction: Principal | ICD-10-CM | POA: Insufficient documentation

## 2024-01-14 DIAGNOSIS — Z87891 Personal history of nicotine dependence: Secondary | ICD-10-CM | POA: Diagnosis not present

## 2024-01-14 DIAGNOSIS — R1011 Right upper quadrant pain: Secondary | ICD-10-CM | POA: Diagnosis present

## 2024-01-15 ENCOUNTER — Observation Stay (HOSPITAL_COMMUNITY): Admitting: Certified Registered Nurse Anesthetist

## 2024-01-15 ENCOUNTER — Observation Stay (HOSPITAL_COMMUNITY)
Admission: EM | Admit: 2024-01-15 | Discharge: 2024-01-16 | Disposition: A | Discharge status: Home / Self Care | Attending: General Surgery | Admitting: General Surgery

## 2024-01-15 ENCOUNTER — Observation Stay (HOSPITAL_COMMUNITY)

## 2024-01-15 ENCOUNTER — Emergency Department (HOSPITAL_COMMUNITY)

## 2024-01-15 ENCOUNTER — Encounter (HOSPITAL_COMMUNITY): Payer: Self-pay

## 2024-01-15 ENCOUNTER — Other Ambulatory Visit: Payer: Self-pay

## 2024-01-15 ENCOUNTER — Observation Stay (HOSPITAL_BASED_OUTPATIENT_CLINIC_OR_DEPARTMENT_OTHER): Admitting: Certified Registered Nurse Anesthetist

## 2024-01-15 ENCOUNTER — Encounter (HOSPITAL_COMMUNITY)
Admission: EM | Disposition: A | Discharge status: Home / Self Care | Payer: Self-pay | Source: Home / Self Care | Attending: Emergency Medicine

## 2024-01-15 DIAGNOSIS — K8012 Calculus of gallbladder with acute and chronic cholecystitis without obstruction: Secondary | ICD-10-CM | POA: Diagnosis not present

## 2024-01-15 DIAGNOSIS — K819 Cholecystitis, unspecified: Secondary | ICD-10-CM | POA: Diagnosis present

## 2024-01-15 DIAGNOSIS — K810 Acute cholecystitis: Principal | ICD-10-CM

## 2024-01-15 HISTORY — PX: CHOLECYSTECTOMY: SHX55

## 2024-01-15 LAB — COMPREHENSIVE METABOLIC PANEL WITH GFR
ALT: 20 U/L (ref 0–44)
AST: 23 U/L (ref 15–41)
Albumin: 4.6 g/dL (ref 3.5–5.0)
Alkaline Phosphatase: 110 U/L (ref 38–126)
Anion gap: 16 — ABNORMAL HIGH (ref 5–15)
BUN: 13 mg/dL (ref 6–20)
CO2: 22 mmol/L (ref 22–32)
Calcium: 9.5 mg/dL (ref 8.9–10.3)
Chloride: 100 mmol/L (ref 98–111)
Creatinine, Ser: 0.72 mg/dL (ref 0.44–1.00)
GFR, Estimated: >60 mL/min (ref >60)
Glucose, Bld: 101 mg/dL — ABNORMAL HIGH (ref 70–99)
Potassium: 3.9 mmol/L (ref 3.5–5.1)
Sodium: 138 mmol/L (ref 135–145)
Total Bilirubin: 0.6 mg/dL (ref 0.0–1.2)
Total Protein: 7.3 g/dL (ref 6.5–8.1)

## 2024-01-15 LAB — CBC
HCT: 45.1 % (ref 36.0–46.0)
Hemoglobin: 14.8 g/dL (ref 12.0–15.0)
MCH: 28.4 pg (ref 26.0–34.0)
MCHC: 32.8 g/dL (ref 30.0–36.0)
MCV: 86.4 fL (ref 80.0–100.0)
Platelets: 327 K/uL (ref 150–400)
RBC: 5.22 MIL/uL — ABNORMAL HIGH (ref 3.87–5.11)
RDW: 14.6 % (ref 11.5–15.5)
WBC: 6.1 K/uL (ref 4.0–10.5)
nRBC: 0 % (ref 0.0–0.2)

## 2024-01-15 LAB — LIPASE, BLOOD: Lipase: 27 U/L (ref 11–51)

## 2024-01-15 LAB — HIV ANTIBODY (ROUTINE TESTING W REFLEX): HIV Screen 4th Generation wRfx: NONREACTIVE

## 2024-01-15 LAB — HCG, SERUM, QUALITATIVE: Preg, Serum: NEGATIVE

## 2024-01-15 SURGERY — LAPAROSCOPIC CHOLECYSTECTOMY
Anesthesia: General | Site: Abdomen

## 2024-01-15 MED ORDER — LIDOCAINE HCL (PF) 2 % IJ SOLN
INTRAMUSCULAR | Status: AC
  Filled 2024-01-15: qty 5

## 2024-01-15 MED ORDER — OXYCODONE HCL 5 MG PO TABS: 5.0000 mg | ORAL_TABLET | Freq: Once | ORAL | Status: DC | PRN

## 2024-01-15 MED ORDER — POTASSIUM CHLORIDE IN NACL 20-0.9 MEQ/L-% IV SOLN
INTRAVENOUS | Status: DC
  Filled 2024-01-15: qty 1000

## 2024-01-15 MED ORDER — IOHEXOL 300 MG/ML  SOLN
100.0000 mL | Freq: Once | INTRAMUSCULAR | Status: AC | PRN
  Administered 2024-01-15: 100 mL via INTRAVENOUS

## 2024-01-15 MED ORDER — ACETAMINOPHEN 10 MG/ML IV SOLN
INTRAVENOUS | Status: AC
  Filled 2024-01-15: qty 100

## 2024-01-15 MED ORDER — AMISULPRIDE (ANTIEMETIC) 5 MG/2ML IV SOLN
10.0000 mg | Freq: Once | INTRAVENOUS | Status: AC | PRN
  Administered 2024-01-15: 10 mg via INTRAVENOUS

## 2024-01-15 MED ORDER — OXYCODONE HCL 5 MG PO TABS
5.0000 mg | ORAL_TABLET | ORAL | Status: DC | PRN
  Administered 2024-01-15 – 2024-01-16 (×4): 5 mg via ORAL
  Filled 2024-01-15 (×4): qty 1

## 2024-01-15 MED ORDER — FENTANYL CITRATE PF 50 MCG/ML IJ SOSY
25.0000 ug | PREFILLED_SYRINGE | INTRAMUSCULAR | Status: DC | PRN
  Administered 2024-01-15 (×2): 50 ug via INTRAVENOUS

## 2024-01-15 MED ORDER — DEXAMETHASONE SODIUM PHOSPHATE 4 MG/ML IJ SOLN
INTRAMUSCULAR | Status: DC | PRN
  Administered 2024-01-15: 5 mg via INTRAVENOUS

## 2024-01-15 MED ORDER — KETOROLAC TROMETHAMINE 30 MG/ML IJ SOLN
INTRAMUSCULAR | Status: AC
  Filled 2024-01-15: qty 1

## 2024-01-15 MED ORDER — MIDAZOLAM HCL 2 MG/2ML IJ SOLN
INTRAMUSCULAR | Status: AC
  Filled 2024-01-15: qty 2

## 2024-01-15 MED ORDER — MORPHINE SULFATE (PF) 4 MG/ML IV SOLN
4.0000 mg | Freq: Once | INTRAVENOUS | Status: AC
  Administered 2024-01-15: 4 mg via INTRAVENOUS
  Filled 2024-01-15: qty 1

## 2024-01-15 MED ORDER — SUGAMMADEX SODIUM 200 MG/2ML IV SOLN
INTRAVENOUS | Status: DC | PRN
  Administered 2024-01-15: 200 mg via INTRAVENOUS

## 2024-01-15 MED ORDER — ONDANSETRON HCL 4 MG/2ML IJ SOLN
4.0000 mg | Freq: Once | INTRAMUSCULAR | Status: AC
  Administered 2024-01-15: 4 mg via INTRAVENOUS
  Filled 2024-01-15: qty 2

## 2024-01-15 MED ORDER — OXYCODONE HCL 5 MG PO TABS
ORAL_TABLET | ORAL | Status: AC
Start: 2024-01-15 — End: 2024-01-15
  Filled 2024-01-15: qty 1

## 2024-01-15 MED ORDER — FENTANYL CITRATE (PF) 100 MCG/2ML IJ SOLN
INTRAMUSCULAR | Status: AC
Start: 2024-01-15 — End: 2024-01-15
  Filled 2024-01-15: qty 2

## 2024-01-15 MED ORDER — HYDROMORPHONE HCL 1 MG/ML IJ SOLN: 1.0000 mg | INTRAMUSCULAR | Status: DC | PRN

## 2024-01-15 MED ORDER — FENTANYL CITRATE PF 50 MCG/ML IJ SOSY
PREFILLED_SYRINGE | INTRAMUSCULAR | Status: AC
Start: 2024-01-15 — End: 2024-01-15
  Filled 2024-01-15: qty 2

## 2024-01-15 MED ORDER — SUGAMMADEX SODIUM 200 MG/2ML IV SOLN
INTRAVENOUS | Status: AC
  Filled 2024-01-15: qty 2

## 2024-01-15 MED ORDER — ONDANSETRON HCL 4 MG/2ML IJ SOLN
INTRAMUSCULAR | Status: DC | PRN
  Administered 2024-01-15: 4 mg via INTRAVENOUS

## 2024-01-15 MED ORDER — SODIUM CHLORIDE 0.9 % IV SOLN
2.0000 g | Freq: Once | INTRAVENOUS | Status: AC
  Administered 2024-01-15: 2 g via INTRAVENOUS
  Filled 2024-01-15: qty 20

## 2024-01-15 MED ORDER — MIDAZOLAM HCL 5 MG/5ML IJ SOLN
INTRAMUSCULAR | Status: DC | PRN
  Administered 2024-01-15: 2 mg via INTRAVENOUS

## 2024-01-15 MED ORDER — SODIUM CHLORIDE 0.9 % IV BOLUS
1000.0000 mL | Freq: Once | INTRAVENOUS | Status: AC
  Administered 2024-01-15: 1000 mL via INTRAVENOUS

## 2024-01-15 MED ORDER — LACTATED RINGERS IV SOLN: INTRAVENOUS | Status: DC | PRN

## 2024-01-15 MED ORDER — ONDANSETRON HCL 4 MG/2ML IJ SOLN
INTRAMUSCULAR | Status: AC
  Filled 2024-01-15: qty 2

## 2024-01-15 MED ORDER — SPY AGENT GREEN - (INDOCYANINE FOR INJECTION)
1.2500 mg | Freq: Once | INTRAMUSCULAR | Status: DC
  Filled 2024-01-15: qty 10

## 2024-01-15 MED ORDER — ENOXAPARIN SODIUM 40 MG/0.4ML IJ SOSY
40.0000 mg | PREFILLED_SYRINGE | INTRAMUSCULAR | Status: DC
  Administered 2024-01-16: 40 mg via SUBCUTANEOUS
  Filled 2024-01-15 (×2): qty 0.4

## 2024-01-15 MED ORDER — ONDANSETRON 4 MG PO TBDP
4.0000 mg | ORAL_TABLET | Freq: Four times a day (QID) | ORAL | Status: DC | PRN
Start: 2024-01-15 — End: 2024-01-16

## 2024-01-15 MED ORDER — ROCURONIUM BROMIDE 10 MG/ML (PF) SYRINGE
PREFILLED_SYRINGE | INTRAVENOUS | Status: AC
  Filled 2024-01-15: qty 10

## 2024-01-15 MED ORDER — PROPOFOL 10 MG/ML IV BOLUS
INTRAVENOUS | Status: DC | PRN
  Administered 2024-01-15: 50 mg via INTRAVENOUS
  Administered 2024-01-15: 150 mg via INTRAVENOUS
  Administered 2024-01-15: 50 mg via INTRAVENOUS

## 2024-01-15 MED ORDER — LACTATED RINGERS IV SOLN
INTRAVENOUS | Status: AC | PRN
  Administered 2024-01-15: 1000 mL

## 2024-01-15 MED ORDER — OXYCODONE HCL 5 MG/5ML PO SOLN: 5.0000 mg | Freq: Once | ORAL | Status: DC | PRN

## 2024-01-15 MED ORDER — ACETAMINOPHEN 10 MG/ML IV SOLN
INTRAVENOUS | Status: DC | PRN
Start: 2024-01-15 — End: 2024-01-15
  Administered 2024-01-15: 1000 mg via INTRAVENOUS

## 2024-01-15 MED ORDER — BUPIVACAINE-EPINEPHRINE (PF) 0.25% -1:200000 IJ SOLN
INTRAMUSCULAR | Status: AC
  Filled 2024-01-15: qty 30

## 2024-01-15 MED ORDER — ROCURONIUM BROMIDE 10 MG/ML (PF) SYRINGE
PREFILLED_SYRINGE | INTRAVENOUS | Status: DC | PRN
  Administered 2024-01-15: 50 mg via INTRAVENOUS

## 2024-01-15 MED ORDER — FENTANYL CITRATE (PF) 100 MCG/2ML IJ SOLN
INTRAMUSCULAR | Status: DC | PRN
  Administered 2024-01-15: 100 ug via INTRAVENOUS
  Administered 2024-01-15 (×2): 50 ug via INTRAVENOUS

## 2024-01-15 MED ORDER — 0.9 % SODIUM CHLORIDE (POUR BTL) OPTIME
TOPICAL | Status: DC | PRN
  Administered 2024-01-15: 1000 mL

## 2024-01-15 MED ORDER — SPY AGENT GREEN - (INDOCYANINE FOR INJECTION)
INTRAMUSCULAR | Status: DC | PRN
  Administered 2024-01-15: 1 mL via INTRAVENOUS

## 2024-01-15 MED ORDER — OXYCODONE HCL 5 MG PO TABS: 5.0000 mg | ORAL_TABLET | Freq: Four times a day (QID) | ORAL | 0 refills | Status: AC | PRN

## 2024-01-15 MED ORDER — AMISULPRIDE (ANTIEMETIC) 5 MG/2ML IV SOLN
INTRAVENOUS | Status: AC
  Filled 2024-01-15: qty 4

## 2024-01-15 MED ORDER — KETOROLAC TROMETHAMINE 30 MG/ML IJ SOLN
INTRAMUSCULAR | Status: DC | PRN
  Administered 2024-01-15: 30 mg via INTRAVENOUS

## 2024-01-15 MED ORDER — DEXAMETHASONE SODIUM PHOSPHATE 10 MG/ML IJ SOLN
INTRAMUSCULAR | Status: AC
  Filled 2024-01-15: qty 1

## 2024-01-15 MED ORDER — METRONIDAZOLE 500 MG/100ML IV SOLN
500.0000 mg | Freq: Once | INTRAVENOUS | Status: AC
  Administered 2024-01-15: 500 mg via INTRAVENOUS
  Filled 2024-01-15: qty 100

## 2024-01-15 MED ORDER — ACETAMINOPHEN 500 MG PO TABS: 1000.0000 mg | ORAL_TABLET | ORAL | Status: DC

## 2024-01-15 MED ORDER — LIDOCAINE 2% (20 MG/ML) 5 ML SYRINGE
INTRAMUSCULAR | Status: DC | PRN
Start: 2024-01-15 — End: 2024-01-15
  Administered 2024-01-15: 60 mg via INTRAVENOUS

## 2024-01-15 MED ORDER — ONDANSETRON HCL 4 MG/2ML IJ SOLN: 4.0000 mg | Freq: Four times a day (QID) | INTRAMUSCULAR | Status: DC | PRN

## 2024-01-15 MED ORDER — BUPIVACAINE-EPINEPHRINE 0.25% -1:200000 IJ SOLN
INTRAMUSCULAR | Status: DC | PRN
  Administered 2024-01-15: 15 mL

## 2024-01-15 MED ORDER — PROPOFOL 500 MG/50ML IV EMUL
INTRAVENOUS | Status: AC
  Filled 2024-01-15: qty 50

## 2024-01-15 SURGICAL SUPPLY — 37 items
BAG COUNTER SPONGE SURGICOUNT (BAG) IMPLANT
BENZOIN TINCTURE PRP APPL 2/3 (GAUZE/BANDAGES/DRESSINGS) IMPLANT
CABLE HIGH FREQUENCY MONO STRZ (ELECTRODE) ×1 IMPLANT
CHLORAPREP W/TINT 26 (MISCELLANEOUS) ×1 IMPLANT
CLIP APPLIE 5 13 M/L LIGAMAX5 (MISCELLANEOUS) ×1 IMPLANT
CLIP APPLIE ROT 10 11.4 M/L (STAPLE) IMPLANT
COVER MAYO STAND XLG (MISCELLANEOUS) ×1 IMPLANT
COVER SURGICAL LIGHT HANDLE (MISCELLANEOUS) ×1 IMPLANT
DERMABOND ADVANCED .7 DNX12 (GAUZE/BANDAGES/DRESSINGS) IMPLANT
DRAPE C-ARM 42X120 X-RAY (DRAPES) IMPLANT
ELECT REM PT RETURN 15FT ADLT (MISCELLANEOUS) ×1 IMPLANT
ENDOLOOP SUT PDS II 0 18 (SUTURE) IMPLANT
GLOVE BIO SURGEON STRL SZ7 (GLOVE) ×1 IMPLANT
GLOVE BIOGEL PI IND STRL 7.5 (GLOVE) ×1 IMPLANT
GOWN STRL REUS W/ TWL LRG LVL3 (GOWN DISPOSABLE) ×1 IMPLANT
GRASPER SUT TROCAR 14GX15 (MISCELLANEOUS) IMPLANT
HEMOSTAT SNOW SURGICEL 2X4 (HEMOSTASIS) IMPLANT
IRRIGATION SUCT STRKRFLW 2 WTP (MISCELLANEOUS) ×1 IMPLANT
KIT BASIN OR (CUSTOM PROCEDURE TRAY) ×1 IMPLANT
KIT TURNOVER KIT A (KITS) ×1 IMPLANT
PENCIL SMOKE EVACUATOR (MISCELLANEOUS) IMPLANT
POUCH RETRIEVAL ECOSAC 10 (ENDOMECHANICALS) ×1 IMPLANT
PROTECTOR NERVE ULNAR (MISCELLANEOUS) IMPLANT
SCISSORS LAP 5X35 DISP (ENDOMECHANICALS) ×1 IMPLANT
SET CHOLANGIOGRAPH MIX (MISCELLANEOUS) IMPLANT
SET TUBE SMOKE EVAC HIGH FLOW (TUBING) ×1 IMPLANT
SLEEVE Z-THREAD 5X100MM (TROCAR) ×2 IMPLANT
SPIKE FLUID TRANSFER (MISCELLANEOUS) ×1 IMPLANT
STRIP CLOSURE SKIN 1/2X4 (GAUZE/BANDAGES/DRESSINGS) IMPLANT
SUT MNCRL AB 4-0 PS2 18 (SUTURE) ×1 IMPLANT
SUT VICRYL 0 TIES 12 18 (SUTURE) IMPLANT
SUT VICRYL 0 UR6 27IN ABS (SUTURE) IMPLANT
TOWEL OR 17X26 10 PK STRL BLUE (TOWEL DISPOSABLE) ×1 IMPLANT
TRAY LAPAROSCOPIC (CUSTOM PROCEDURE TRAY) ×1 IMPLANT
TROCAR 11X100 Z THREAD (TROCAR) IMPLANT
TROCAR BALLN 12MMX100 BLUNT (TROCAR) ×1 IMPLANT
TROCAR Z-THREAD OPTICAL 5X100M (TROCAR) ×1 IMPLANT

## 2024-01-15 NOTE — ED Triage Notes (Signed)
 POV, pt complaints of back pain, feels like my gallbladder. PT finds comfort on tripod position. Report constant vomit, Feel similar but worse than previous episodes.    Pain 10 out 10. Aox4

## 2024-01-15 NOTE — ED Provider Notes (Signed)
 [place] EMERGENCY DEPARTMENT AT [facility] Provider Note   CSN: [id number] Arrival date & time: 01/14/24  2345     Patient presents with: No chief complaint on file.   Sonia Maxwell is a 38 y.o. female with history of trichomoniasis, BV.  Presents to ED for evaluation of right upper flank, abdominal pain.  Reports that for the last 2 days, ever since eating fried food over the weekend, she has had excruciating right upper back, right flank, right upper quadrant pain.  Reports that she has history of gallstones.  States that I believe it is more gallbladder.  She endorses multiple episodes of nausea and vomiting at home.  Endorsing looser stools but denies overt diarrhea.  Denies dysuria, fevers.  No longer menstruating.  States she has seen her PCP for this in the past and was advised this might be musculoskeletal in nature.  Has not followed-up with surgery.  Denies chest pain or shortness of breath.  HPI     Prior to Admission medications   Medication Sig Start Date End Date Taking? Authorizing Provider  cyclobenzaprine (FLEXERIL) 10 MG tablet Take 10 mg by mouth 3 (three) times daily as needed for muscle spasms.   Yes [provider]  benzonatate  (TESSALON ) 100 MG capsule Take 1 capsule (100 mg total) by mouth every 8 (eight) hours as needed for cough. Patient not taking: Reported on 01/15/2024 04/24/19   Landy Honora CROME, PA-C  ibuprofen  (ADVIL ,MOTRIN ) 600 MG tablet Take 1 tablet (600 mg total) by mouth every 6 (six) hours. Patient not taking: Reported on 01/15/2024 06/10/16   Estelle Service, MD  oxyCODONE  (OXY IR/ROXICODONE ) 5 MG immediate release tablet Take 1-2 tablets (5-10 mg total) by mouth every 4 (four) hours as needed for moderate pain. Patient not taking: Reported on 01/15/2024 06/10/16   Estelle Service, MD    Allergies: Patient has no known allergies.    Review of Systems  Gastrointestinal:  Positive for abdominal pain, nausea and vomiting.   All other systems reviewed and are negative.   Updated Vital Signs BP 119/78   Pulse 70   Temp 98.2 F (36.8 C)   Resp 20   SpO2 97%   Physical Exam Vitals and nursing note reviewed.  Constitutional:      General: She is not in acute distress.    Appearance: She is well-developed.  HENT:     Head: Normocephalic and atraumatic.  Eyes:     Conjunctiva/sclera: Conjunctivae normal.  Cardiovascular:     Rate and Rhythm: Normal rate and regular rhythm.     Heart sounds: No murmur heard. Pulmonary:     Effort: Pulmonary effort is normal. No respiratory distress.     Breath sounds: Normal breath sounds.  Abdominal:     Palpations: Abdomen is soft.     Tenderness: There is abdominal tenderness.     Comments: Right upper quadrant tenderness.  Positive Murphy sign.  Musculoskeletal:        General: No swelling.     Cervical back: Neck supple.  Skin:    General: Skin is warm and dry.     Capillary Refill: Capillary refill takes less than 2 seconds.  Neurological:     Mental Status: She is alert.  Psychiatric:        Mood and Affect: Mood normal.     (all labs ordered are listed, but only abnormal results are displayed) Labs Reviewed  COMPREHENSIVE METABOLIC PANEL WITH GFR - Abnormal; Notable for the  following components:      Result Value   Glucose, Bld 101 (*)    Anion gap 16 (*)    All other components within normal limits  CBC - Abnormal; Notable for the following components:   RBC 5.22 (*)    All other components within normal limits  LIPASE, BLOOD  HCG, SERUM, QUALITATIVE  URINALYSIS, ROUTINE W REFLEX MICROSCOPIC    EKG: None  Radiology: CT ABDOMEN PELVIS W CONTRAST Result Date: 01/15/2024 CLINICAL DATA:  Right upper quadrant abdominal pain EXAM: CT ABDOMEN AND PELVIS WITH CONTRAST TECHNIQUE: Multidetector CT imaging of the abdomen and pelvis was performed using the standard protocol following bolus administration of intravenous contrast. RADIATION DOSE  REDUCTION: This exam was performed according to the departmental dose-optimization program which includes automated exposure control, adjustment of the mA and/or kV according to patient size and/or use of iterative reconstruction technique. CONTRAST:  OMNIPAQUE  IOHEXOL  300 MG/ML  SOLN COMPARISON:  None Available. FINDINGS: Lower chest: No acute abnormality. Hepatobiliary: The gallbladder is distended, there is gallbladder wall thickening noted, and mucosal hyperemia evident there is mild edema in the region of the cystic duct as well as shotty adenopathy and, together, the findings are suggestive of acute cholecystitis. No pericholecystic fluid collections. No intra or extrahepatic biliary ductal dilation. Mild hepatic steatosis. No enhancing intrahepatic mass. Pancreas: Unremarkable Spleen: Unremarkable Adrenals/Urinary Tract: Stable nodular thickening of the left adrenal gland. Right adrenal gland is unremarkable. The kidneys are unremarkable. The bladder is unremarkable. Stomach/Bowel: Stomach is within normal limits. Appendix appears normal. No evidence of bowel wall thickening, distention, or inflammatory changes. Vascular/Lymphatic: Retroaortic left renal vein. The abdominal vasculature is otherwise unremarkable. Reproductive: Intrauterine device in expected position uterine cavity. The uterus is retroverted. Pelvic organs are otherwise unremarkable. Other: No abdominal wall hernia Musculoskeletal: No acute or significant osseous findings. IMPRESSION: 1. Findings suggestive of acute cholecystitis. Correlation with liver enzymes and possible hepatobiliary scintigraphy may be helpful for confirmation. 2. Mild hepatic steatosis. Electronically Signed   By: Dorethia Molt M.D.   On: 01/15/2024 03:49    Procedures   Medications Ordered in the ED  cefTRIAXone  (ROCEPHIN ) 2 g in sodium chloride  0.9 % 100 mL IVPB (2 g Intravenous New Bag/Given 01/15/24 0416)  metroNIDAZOLE  (FLAGYL ) IVPB 500 mg (has no  administration in time range)  ondansetron  (ZOFRAN ) injection 4 mg (4 mg Intravenous Given 01/15/24 0033)  sodium chloride  0.9 % bolus 1,000 mL (0 mLs Intravenous Stopped 01/15/24 0142)  morphine  (PF) 4 MG/ML injection 4 mg (4 mg Intravenous Given 01/15/24 0109)  iohexol  (OMNIPAQUE ) 300 MG/ML solution 100 mL (100 mLs Intravenous Contrast Given 01/15/24 0231)    Medical Decision Making Amount and/or Complexity of Data Reviewed Labs: ordered. Radiology: ordered.  Risk Prescription drug management. Decision regarding hospitalization.   This is a 38 year old female presenting to the ED out of concern of right upper quadrant abdominal pain radiating into the back.  On exam, she is hemodynamically stable.  Afebrile and nontachycardic.  Lung sounds clear bilaterally, no hypoxia.  Abdomen soft and compressible tenderness in the right upper quadrant and a positive Murphy sign.  Neuroexam at baseline.  Overall nontoxic in appearance.  Labs collected to include CBC, CMP, lipase, hCG, urinalysis.  I have added on CT abdomen pelvis to further assess.  Patient given 4 mg of morphine , 1 L of fluid, 4 of Zofran .  After medications, patient pain to decrease.  Patient CBC without leukocytosis or anemia.  Metabolic panel with anion gap 16,  no electrolyte derangement, no elevated LFT.  Lipase 27.  hCG negative.  Patient CT abdomen pelvis shows acute cholecystitis.  Discussed with on-call surgeon Dr. Vernetta who has agreed to admit patient.  Patient made n.p.o., started on broad-spectrum antibiotics to include Rocephin , Flagyl .  Made n.p.o.  Stable on admission.    Final diagnoses:  Acute cholecystitis    ED Discharge Orders     None          Ruthell Lonni JULIANNA DEVONNA 01/15/24 0420    Theadore Ozell HERO, MD 01/15/24 [phone]

## 2024-01-15 NOTE — Discharge Instructions (Signed)
 [facility] LAPAROSCOPIC SURGERY: POST OP INSTRUCTIONS  Always review your discharge instruction sheet given to you by the facility where your surgery was performed. IF YOU HAVE DISABILITY OR FAMILY LEAVE FORMS, YOU MUST BRING THEM TO THE OFFICE FOR PROCESSING.   DO NOT GIVE THEM TO YOUR DOCTOR.  A prescription for pain medication may be given to you upon discharge.  Take your pain medication as prescribed, if needed.  If narcotic pain medicine is not needed, then you may take acetaminophen  (Tylenol ), naprosyn (Alleve), or ibuprofen  (Advil ) as needed. Take your usually prescribed medications unless otherwise directed. If you need a refill on your pain medication, please contact your pharmacy.  They will contact our office to request authorization. Prescriptions will not be filled after 5pm or on week-ends. You should follow a light diet the first few days after arrival home, such as soup and crackers, etc.  Be sure to include lots of fluids daily. Most patients will experience some swelling and bruising in the area of the incisions.  Ice packs will help.  Swelling and bruising can take several days to resolve.  It is common to experience some constipation if taking pain medication after surgery.  Increasing fluid intake and taking a stool softener (such as Colace) will usually help or prevent this problem from occurring.  A mild laxative (Milk of Magnesia or Miralax) should be taken according to package instructions if there are no bowel movements after 48 hours. Unless discharge instructions indicate otherwise, you may remove your bandages 48 hours after surgery, and you may shower at that time.  You may have steri-strips (small skin tapes) in place directly over the incision.  These strips should be left on the skin for 7-10 days.  If your surgeon used skin glue on the incision, you may shower in 24 hours.  The glue will flake off over the next  2-3 weeks.  Any sutures or staples will be removed at the office during your follow-up visit. ACTIVITIES:  You may resume regular (light) daily activities beginning the next day--such as daily self-care, walking, climbing stairs--gradually increasing activities as tolerated.  You may have sexual intercourse when it is comfortable.  Refrain from any heavy lifting or straining until approved by your doctor. You may drive when you are no longer taking prescription pain medication, you can comfortably wear a seatbelt, and you can safely maneuver your car and apply brakes. RETURN TO WORK:  __________________________________________________________ Sonia Maxwell should see your doctor in the office for a follow-up appointment approximately 2-3 weeks after your surgery.  Make sure that you call for this appointment within a day or two after you arrive home to insure a convenient appointment time. OTHER INSTRUCTIONS: __________________________________________________________________________________________________________________________ __________________________________________________________________________________________________________________________ WHEN TO CALL YOUR DOCTOR: Fever over 101.0 Inability to urinate Continued bleeding from incision. Increased pain, redness, or drainage from the incision. Increasing abdominal pain  The clinic staff is available to answer your questions during regular business hours.  Please don't hesitate to call and ask to speak to one of the nurses for clinical concerns.  If you have a medical emergency, go to the nearest emergency room or call 911.  A surgeon from [facility] is always on call at the hospital. [address], [address], [place], [place]  [address] ? [address] [address], [place], [place]   [address] [phone] ? [phone] ? FAX [phone] Web site: [email or web address]

## 2024-01-15 NOTE — Transfer of Care (Signed)
 Immediate Anesthesia Transfer of Care Note  Patient: Sonia Maxwell  Procedure(s) Performed: LAPAROSCOPIC CHOLECYSTECTOMY (Abdomen)  Patient Location: PACU  Anesthesia Type:General  Level of Consciousness: awake  Airway & Oxygen Therapy: Patient Spontanous Breathing  Post-op Assessment: Report given to RN and Post -op Vital signs reviewed and stable  Post vital signs: Reviewed and stable  Last Vitals:  Vitals Value Taken Time  BP 158/113 01/15/24 11:55  Temp    Pulse 96 01/15/24 11:56  Resp 21 01/15/24 11:56  SpO2 90 % 01/15/24 11:56  Vitals shown include unfiled device data.  Last Pain:  Vitals:   01/15/24 0930  TempSrc: Oral  PainSc:          Complications: No notable events documented.

## 2024-01-15 NOTE — Op Note (Addendum)
 Preoperative diagnosis: symptomatic cholelithiasis Postoperative diagnosis acute on chronic cholecystitis Procedure: Laparoscopic cholecystectomy Surgeon: Dr. Adina Bury Anesthesia: General  Complications: None Drains: None Estimated blood loss: Minimal Specimens: Gallbladder and contents to pathology Sponge count was correct at completion Disposition recovery stable   Indications: 86 yof with history of ab pain and flank pain. She was in ER 6/25 for evaluation and had ct of her abdomen renal stone protocol that was negative. For past 2 days she had right upper back/flank and uq pain after eating food.  She had n/v at home.  She was seen in ER and had a CT scan that shows distended gb with thickened wall, no fluid, no ductal dilatation.  LFTs, lipase and WBC are all normal. There are no gallstones on her CT scan. US  does show gallstones. She has some residual ruq pain when I see her. We discussed lap chole.      Procedure: After informed consent was obtained she was taken to the operating room.  She was given antibiotics.  SCDs were placed.  She was placed under anesthesia without complication.  She was prepped and draped in a standard sterile surgical fashion.  A surgical timeout was then performed.   I made a vertical incision above the umbilicus. I incised the fascia and entered the peritoneum bluntly.  I placed a 0 vicryl pursestring suture and inserted a Hasson trocar.     The abdomen was insufflated to 15 mmHg pressure.   I then placed 3 additional 5 mm trocars in the epigastrium and right side of the abdomen under direct vision. She had acute on chronic cholecystitis.  The gallbladder was then retracted cephalad and lateral.   I was then able to dissect the triangle and clearly obtain a critical view of safety.  I took the gallbladder off the liver bed for some distance just to get this.  The green dye was visible in the common duct and the cystic duct confirming my anatomy. I ended up  taking the gallbladder completely off the liver bed as it was very inflamed lower down.  I placed several clips on an arterial branch high on the gallbladder. I then used two endoloops on the gallbladder. I divided the gallbladder and there was  purulence in it. The gallbladder was then placed in a retrieval bag.  I obtained hemostasis. I then removed the gallbladder in the retrieval bag. I did place some snow in the liver bed.  The umbilical  trocar was removed. I placed a 2-0 Vicryl suture to completely obliterate the umbilical defect.  I then removed the remaining trocars and desufflated the abdomen.  These were closed with 4-0 Monocryl and glue.  She tolerated this well was extubated and transferred recovery stable

## 2024-01-15 NOTE — Progress Notes (Signed)
 Pt ate a full reg meal and tolerated well

## 2024-01-15 NOTE — Anesthesia Preprocedure Evaluation (Addendum)
 Anesthesia Evaluation  Patient identified by MRN, date of birth, ID band Patient awake    Reviewed: Allergy & Precautions, NPO status , Patient's Chart, lab work & pertinent test results  Airway Mallampati: III  TM Distance: >3 FB Neck ROM: Full    Dental no notable dental hx. (+) Teeth Intact, Dental Advisory Given   Pulmonary former smoker   Pulmonary exam normal breath sounds clear to auscultation       Cardiovascular negative cardio ROS Normal cardiovascular exam Rhythm:Regular Rate:Normal     Neuro/Psych negative neurological ROS  negative psych ROS   GI/Hepatic negative GI ROS, Neg liver ROS,,,  Endo/Other  negative endocrine ROS    Renal/GU negative Renal ROS  negative genitourinary   Musculoskeletal negative musculoskeletal ROS (+)    Abdominal   Peds  Hematology negative hematology ROS (+)   Anesthesia Other Findings   Reproductive/Obstetrics                              Anesthesia Physical Anesthesia Plan  ASA: 1 and emergent  Anesthesia Plan: General   Post-op Pain Management: Ofirmev  IV (intra-op)*   Induction: Intravenous  PONV Risk Score and Plan: 3 and Midazolam , Dexamethasone  and Ondansetron   Airway Management Planned: Oral ETT  Additional Equipment:   Intra-op Plan:   Post-operative Plan: Extubation in OR  Informed Consent: I have reviewed the patients History and Physical, chart, labs and discussed the procedure including the risks, benefits and alternatives for the proposed anesthesia with the patient or authorized representative who has indicated his/her understanding and acceptance.     Dental advisory given  Plan Discussed with: CRNA  Anesthesia Plan Comments:          Anesthesia Quick Evaluation

## 2024-01-15 NOTE — Anesthesia Postprocedure Evaluation (Signed)
 Anesthesia Post Note  Patient: Sonia Maxwell, hospital  Procedure(s) Performed: LAPAROSCOPIC CHOLECYSTECTOMY (Abdomen)     Patient location during evaluation: PACU Anesthesia Type: General Level of consciousness: awake and alert Pain management: pain level controlled Vital Signs Assessment: post-procedure vital signs reviewed and stable Respiratory status: spontaneous breathing, nonlabored ventilation, respiratory function stable and patient connected to nasal cannula oxygen Cardiovascular status: blood pressure returned to baseline and stable Postop Assessment: no apparent nausea or vomiting Anesthetic complications: no   No notable events documented.  Last Vitals:  Vitals:   01/15/24 1245 01/15/24 1300  BP: (!) 116/90 (!) 127/93  Pulse: 62 64  Resp: 14 14  Temp: 36.5 C 36.6 C  SpO2: 95%     Last Pain:  Vitals:   01/15/24 1245  TempSrc:   PainSc: Asleep                 Ashani Pumphrey L Foster Frericks

## 2024-01-15 NOTE — Anesthesia Procedure Notes (Signed)
 Procedure Name: MAC Date/Time: 01/15/2024 10:42 AM  Performed by: Judythe Tanda Aran, CRNAPre-anesthesia Checklist: Patient identified, Emergency Drugs available, Suction available and Patient being monitored Patient Re-evaluated:Patient Re-evaluated prior to induction Oxygen Delivery Method: Simple face mask Preoxygenation: Pre-oxygenation with 100% oxygen Induction Type: IV induction Ventilation: Mask ventilation without difficulty Laryngoscope Size: 2 and Miller Grade View: Grade I Tube type: Oral Tube size: 7.0 mm Number of attempts: 1 Airway Equipment and Method: Stylet Placement Confirmation: ETT inserted through vocal cords under direct vision, positive ETCO2 and breath sounds checked- equal and bilateral Secured at: 21 cm Tube secured with: Tape Dental Injury: Teeth and Oropharynx as per pre-operative assessment

## 2024-01-15 NOTE — H&P (Signed)
 Sonia Maxwell is an 38 y.o. female.   Chief Complaint: ab pain HPI: 82 yof with history of ab pain and flank pain. She was in ER 6/25 for evaluation and had ct of her abdomen renal stone protocol that was negative. For past 2 days she had right upper back/flank and uq pain after eating food.  She had n/v at home.  She was seen in ER and had a CT scan that shows distended gb with thickened wall, no fluid, no ductal dilatation.  LFTs, lipase and WBC are all normal. There are no gallstones on her CT scan. US  does show gallstones. She has some residual ruq pain when I see her.   Past Medical History:  Diagnosis Date   Infection    UTI   Trichimoniasis    Trichomonas contact, treated     Past Surgical History:  Procedure Laterality Date   NO PAST SURGERIES      Family History  Problem Relation Age of Onset   Diabetes Mother    Asthma Sister    Social History:  reports that she has quit smoking. Her smoking use included cigarettes and e-cigarettes. She has a 2 pack-year smoking history. She has never used smokeless tobacco. She reports that she does not drink alcohol and does not use drugs.  Allergies: No Known Allergies  Medications Prior to Admission  Medication Sig Dispense Refill   cyclobenzaprine (FLEXERIL) 10 MG tablet Take 10 mg by mouth 3 (three) times daily as needed for muscle spasms.     benzonatate  (TESSALON ) 100 MG capsule Take 1 capsule (100 mg total) by mouth every 8 (eight) hours as needed for cough. (Patient not taking: Reported on 01/15/2024) 21 capsule 0   ibuprofen  (ADVIL ,MOTRIN ) 600 MG tablet Take 1 tablet (600 mg total) by mouth every 6 (six) hours. (Patient not taking: Reported on 01/15/2024) 30 tablet 0   oxyCODONE  (OXY IR/ROXICODONE ) 5 MG immediate release tablet Take 1-2 tablets (5-10 mg total) by mouth every 4 (four) hours as needed for moderate pain. (Patient not taking: Reported on 01/15/2024) 15 tablet 0    Results for orders placed or performed during the  hospital encounter of 01/15/24 (from the past 48 hours)  Lipase, blood     Status: None   Collection Time: 01/15/24 12:17 AM  Result Value Ref Range   Lipase 27 11 - 51 U/L    Comment: Performed at [facility], [address], [place], [place] [address]  Comprehensive metabolic panel     Status: Abnormal   Collection Time: 01/15/24 12:17 AM  Result Value Ref Range   Sodium 138 135 - 145 mmol/L   Potassium 3.9 3.5 - 5.1 mmol/L   Chloride 100 98 - 111 mmol/L   CO2 22 22 - 32 mmol/L   Glucose, Bld 101 (H) 70 - 99 mg/dL    Comment: Glucose reference range applies only to samples taken after fasting for at least 8 hours.   BUN 13 6 - 20 mg/dL   Creatinine, Ser 9.27 0.44 - 1.00 mg/dL   Calcium 9.5 8.9 - 89.6 mg/dL   Total Protein 7.3 6.5 - 8.1 g/dL   Albumin 4.6 3.5 - 5.0 g/dL   AST 23 15 - 41 U/L   ALT 20 0 - 44 U/L   Alkaline Phosphatase 110 38 - 126 U/L   Total Bilirubin 0.6 0.0 - 1.2 mg/dL   GFR, Estimated >39 >39 mL/min    Comment: (NOTE) Calculated using the CKD-EPI  Creatinine Equation (2021)    Anion gap 16 (H) 5 - 15    Comment: Performed at [facility], [address], [place], [place] [address]  CBC     Status: Abnormal   Collection Time: 01/15/24 12:17 AM  Result Value Ref Range   WBC 6.1 4.0 - 10.5 K/uL   RBC 5.22 (H) 3.87 - 5.11 MIL/uL   Hemoglobin 14.8 12.0 - 15.0 g/dL   HCT 54.8 63.9 - 53.9 %   MCV 86.4 80.0 - 100.0 fL   MCH 28.4 26.0 - 34.0 pg   MCHC 32.8 30.0 - 36.0 g/dL   RDW 85.3 88.4 - 84.4 %   Platelets 327 150 - 400 K/uL   nRBC 0.0 0.0 - 0.2 %    Comment: Performed at [facility], [address], [place], [place] [address]  hCG, serum, qualitative     Status: None   Collection Time: 01/15/24 12:17 AM  Result Value Ref Range   Preg, Serum NEGATIVE NEGATIVE    Comment:        THE SENSITIVITY OF THIS METHODOLOGY IS >10 mIU/mL. Performed at [facility], [address]  [place], [place] [address]    CT ABDOMEN PELVIS W CONTRAST Result Date: 01/15/2024 CLINICAL DATA:  Right upper quadrant abdominal pain EXAM: CT ABDOMEN AND PELVIS WITH CONTRAST TECHNIQUE: Multidetector CT imaging of the abdomen and pelvis was performed using the standard protocol following bolus administration of intravenous contrast. RADIATION DOSE REDUCTION: This exam was performed according to the departmental dose-optimization program which includes automated exposure control, adjustment of the mA and/or kV according to patient size and/or use of iterative reconstruction technique. CONTRAST:  OMNIPAQUE  IOHEXOL  300 MG/ML  SOLN COMPARISON:  None Available. FINDINGS: Lower chest: No acute abnormality. Hepatobiliary: The gallbladder is distended, there is gallbladder wall thickening noted, and mucosal hyperemia evident there is mild edema in the region of the cystic duct as well as shotty adenopathy and, together, the findings are suggestive of acute cholecystitis. No pericholecystic fluid collections. No intra or extrahepatic biliary ductal dilation. Mild hepatic steatosis. No enhancing intrahepatic mass. Pancreas: Unremarkable Spleen: Unremarkable Adrenals/Urinary Tract: Stable nodular thickening of the left adrenal gland. Right adrenal gland is unremarkable. The kidneys are unremarkable. The bladder is unremarkable. Stomach/Bowel: Stomach is within normal limits. Appendix appears normal. No evidence of bowel wall thickening, distention, or inflammatory changes. Vascular/Lymphatic: Retroaortic left renal vein. The abdominal vasculature is otherwise unremarkable. Reproductive: Intrauterine device in expected position uterine cavity. The uterus is retroverted. Pelvic organs are otherwise unremarkable. Other: No abdominal wall hernia Musculoskeletal: No acute or significant osseous findings. IMPRESSION: 1. Findings suggestive of acute cholecystitis. Correlation with liver enzymes and possible hepatobiliary  scintigraphy may be helpful for confirmation. 2. Mild hepatic steatosis. Electronically Signed   By: Dorethia Molt M.D.   On: 01/15/2024 03:49    Review of Systems  All other systems reviewed and are negative.   Blood pressure 120/88, pulse 70, temperature 98.3 F (36.8 C), temperature source Oral, resp. rate 16, SpO2 100%, unknown if currently breastfeeding. Physical Exam Vitals reviewed.  Constitutional:      Appearance: Normal appearance.  Eyes:     General: No scleral icterus. Cardiovascular:     Rate and Rhythm: Normal rate and regular rhythm.  Pulmonary:     Effort: Pulmonary effort is normal.  Abdominal:     General: There is no distension.     Palpations: Abdomen is soft.  Tenderness: There is no abdominal tenderness.  Musculoskeletal:     Cervical back: Neck supple.     Right lower leg: No edema.     Left lower leg: No edema.  Skin:    General: Skin is warm and dry.     Capillary Refill: Capillary refill takes less than 2 seconds.  Neurological:     General: No focal deficit present.     Mental Status: She is alert.  Psychiatric:        Mood and Affect: Mood normal.        Behavior: Behavior normal.      Assessment/Plan Symptomatic cholelithiasis -she has had intermittent symptoms for a few months consistent with biliary disease - I recommended cholecystectomy today -I discussed the procedure in detail.  We discussed the risks and benefits of a laparoscopic cholecystectomy and possible cholangiogram including, but not limited to bleeding, infection, injury to surrounding structures such as the intestine or liver, bile leak, retained gallstones, need to convert to an open procedure, prolonged diarrhea, blood clots such as  DVT, common bile duct injury, anesthesia risks, and possible need for additional procedures. We also discussed small risk of subtotal cholecystectomy.  The likelihood of improvement in symptoms and return to the patient's normal status is  good. We discussed the typical post-operative recovery course.  I reviewed all labs, vitals, ct and us  with gallstones.    Donnice Bury, MD 01/15/2024, 9:29 AM

## 2024-01-16 ENCOUNTER — Encounter (HOSPITAL_COMMUNITY): Payer: Self-pay | Admitting: General Surgery

## 2024-01-16 NOTE — Discharge Summary (Signed)
    Patient ID: Sonia Maxwell [id number] June 13, 1985 38 y.o.  Admit date: 01/15/2024 Discharge date: 01/16/2024  Admitting Diagnosis: Symptomatic cholelithiasis   Discharge Diagnosis POD 1 s/p Laparoscopic cholecystectomy by Dr. Ebbie for acute on chronic cholecystitis   Consultants None  HPI: 38 yof with history of ab pain and flank pain. She was in ER 6/25 for evaluation and had ct of her abdomen renal stone protocol that was negative. For past 2 days she had right upper back/flank and uq pain after eating food.  She had n/v at home.  She was seen in ER and had a CT scan that shows distended gb with thickened wall, no fluid, no ductal dilatation.  LFTs, lipase and WBC are all normal. There are no gallstones on her CT scan. US  does show gallstones. She has some residual ruq pain when I see her.   Procedures Dr. Ebbie - 10/15/23 Laparoscopic cholecystectomy  Hospital Course:  The patient was admitted and underwent a laparoscopic cholecystectomy.  The patient tolerated the procedure well.  On POD 1, the patient was tolerating a regular diet, voiding well, mobilizing, and pain was controlled with oral pain medications.  The patient was stable for DC home at this time with appropriate follow up made. Discussed discharge instructions, restrictions and return/call back precautions.   Physical Exam: Gen:  Alert, NAD, pleasant Pulm:  Rate and effort normal Abd: Soft, no distension, appropriately tender around laparoscopic incisions, no rigidity or guarding and otherwise NT, +BS. Incisions with steri-strips intact and appears well and are without drainage, bleeding, or signs of infection  Psych: A&Ox3   Allergies as of 01/16/2024   No Known Allergies      Medication List     TAKE these medications    benzonatate  100 MG capsule Commonly known as: TESSALON  Take 1 capsule (100 mg total) by mouth every 8 (eight) hours as needed for cough.   cyclobenzaprine 10 MG  tablet Commonly known as: FLEXERIL Take 10 mg by mouth 3 (three) times daily as needed for muscle spasms.   ibuprofen  600 MG tablet Commonly known as: ADVIL  Take 1 tablet (600 mg total) by mouth every 6 (six) hours.   oxyCODONE  5 MG immediate release tablet Commonly known as: Oxy IR/ROXICODONE  Take 1-2 tablets (5-10 mg total) by mouth every 4 (four) hours as needed for moderate pain. What changed: Another medication with the same name was added. Make sure you understand how and when to take each.   oxyCODONE  5 MG immediate release tablet Commonly known as: Oxy IR/ROXICODONE  Take 1 tablet (5 mg total) by mouth every 6 (six) hours as needed. What changed: You were already taking a medication with the same name, and this prescription was added. Make sure you understand how and when to take each.          Follow-up Information     Rossi Silvestro, Puja Gosai, PA-C Follow up on 02/06/2024.   Specialty: General Surgery Why: 9/23 at 11:15. Please bring a copy of your photo ID, insurance card and arrive 30 minutes prior to your appointment for paperwork. Contact information: [address] [place] [address] [phone]                 Signed: Ozell CHRISTELLA Shaper, [facility] 01/16/2024, 10:57 AM Please see Amion for pager number during day hours 7:00am-4:30pm

## 2024-01-16 NOTE — Plan of Care (Signed)

## 2024-01-16 NOTE — Progress Notes (Signed)
   01/16/24 1041  TOC Brief Assessment  Insurance and Status Reviewed  Patient has primary care physician Yes  Home environment has been reviewed home with s/o  Prior level of function: independent  Prior/Current Home Services No current home services  Social Drivers of Health Review SDOH reviewed no interventions necessary  Readmission risk has been reviewed Yes  Transition of care needs no transition of care needs at this time

## 2024-01-17 LAB — SURGICAL PATHOLOGY
